# Patient Record
Sex: Female | Born: 1935 | Race: White | Hispanic: No | Marital: Married
Health system: Southern US, Community
[De-identification: ages and names within clinical notes are randomized; demographics above are authoritative.]

---

## 1999-01-01 ENCOUNTER — Encounter: Admission: RE | Admit: 1999-01-01 | Discharge: 1999-02-03 | Payer: Self-pay | Admitting: Anesthesiology

## 1999-02-04 ENCOUNTER — Encounter: Payer: Self-pay | Admitting: Orthopaedic Surgery

## 1999-02-04 ENCOUNTER — Encounter: Admission: RE | Admit: 1999-02-04 | Discharge: 1999-02-04 | Payer: Self-pay | Admitting: Orthopaedic Surgery

## 1999-05-01 ENCOUNTER — Encounter: Admission: RE | Admit: 1999-05-01 | Discharge: 1999-05-01 | Payer: Self-pay | Admitting: Family Medicine

## 1999-05-01 ENCOUNTER — Encounter: Payer: Self-pay | Admitting: Family Medicine

## 1999-08-13 ENCOUNTER — Encounter: Admission: RE | Admit: 1999-08-13 | Discharge: 1999-08-13 | Payer: Self-pay | Admitting: Family Medicine

## 1999-08-13 ENCOUNTER — Encounter: Payer: Self-pay | Admitting: Family Medicine

## 1999-08-27 ENCOUNTER — Encounter (INDEPENDENT_AMBULATORY_CARE_PROVIDER_SITE_OTHER): Payer: Self-pay | Admitting: Specialist

## 1999-08-27 ENCOUNTER — Encounter: Payer: Self-pay | Admitting: Family Medicine

## 1999-08-27 ENCOUNTER — Ambulatory Visit (HOSPITAL_COMMUNITY): Admission: RE | Admit: 1999-08-27 | Discharge: 1999-08-27 | Payer: Self-pay | Admitting: Family Medicine

## 2000-05-30 ENCOUNTER — Encounter: Payer: Self-pay | Admitting: Family Medicine

## 2000-05-30 ENCOUNTER — Encounter: Admission: RE | Admit: 2000-05-30 | Discharge: 2000-05-30 | Payer: Self-pay | Admitting: Family Medicine

## 2000-06-08 ENCOUNTER — Encounter: Payer: Self-pay | Admitting: Family Medicine

## 2000-06-08 ENCOUNTER — Encounter: Admission: RE | Admit: 2000-06-08 | Discharge: 2000-06-08 | Payer: Self-pay | Admitting: Family Medicine

## 2000-06-22 ENCOUNTER — Encounter: Payer: Self-pay | Admitting: Orthopaedic Surgery

## 2000-06-22 ENCOUNTER — Encounter: Admission: RE | Admit: 2000-06-22 | Discharge: 2000-06-22 | Payer: Self-pay | Admitting: Orthopaedic Surgery

## 2000-09-27 ENCOUNTER — Encounter: Payer: Self-pay | Admitting: Family Medicine

## 2000-09-27 ENCOUNTER — Encounter: Admission: RE | Admit: 2000-09-27 | Discharge: 2000-09-27 | Payer: Self-pay | Admitting: Family Medicine

## 2000-10-26 ENCOUNTER — Encounter: Admission: RE | Admit: 2000-10-26 | Discharge: 2000-10-26 | Payer: Self-pay | Admitting: Neurological Surgery

## 2000-10-26 ENCOUNTER — Encounter: Payer: Self-pay | Admitting: Neurological Surgery

## 2000-11-04 ENCOUNTER — Ambulatory Visit (HOSPITAL_COMMUNITY): Admission: RE | Admit: 2000-11-04 | Discharge: 2000-11-04 | Payer: Self-pay | Admitting: Cardiovascular Disease

## 2000-11-04 ENCOUNTER — Encounter: Payer: Self-pay | Admitting: Cardiovascular Disease

## 2001-01-25 ENCOUNTER — Encounter: Payer: Self-pay | Admitting: Otolaryngology

## 2001-01-25 ENCOUNTER — Encounter: Admission: RE | Admit: 2001-01-25 | Discharge: 2001-01-25 | Payer: Self-pay | Admitting: Otolaryngology

## 2001-06-27 ENCOUNTER — Ambulatory Visit (HOSPITAL_COMMUNITY): Admission: RE | Admit: 2001-06-27 | Discharge: 2001-06-27 | Payer: Self-pay | Admitting: General Surgery

## 2002-03-02 ENCOUNTER — Encounter: Payer: Self-pay | Admitting: General Surgery

## 2002-03-06 ENCOUNTER — Ambulatory Visit (HOSPITAL_COMMUNITY): Admission: RE | Admit: 2002-03-06 | Discharge: 2002-03-06 | Payer: Self-pay | Admitting: General Surgery

## 2002-12-19 ENCOUNTER — Inpatient Hospital Stay (HOSPITAL_COMMUNITY): Admission: EM | Admit: 2002-12-19 | Discharge: 2002-12-21 | Payer: Self-pay | Admitting: Emergency Medicine

## 2003-07-02 ENCOUNTER — Encounter: Admission: RE | Admit: 2003-07-02 | Discharge: 2003-07-02 | Payer: Self-pay | Admitting: Family Medicine

## 2003-08-16 ENCOUNTER — Encounter (HOSPITAL_COMMUNITY): Admission: RE | Admit: 2003-08-16 | Discharge: 2003-11-14 | Payer: Self-pay | Admitting: Nephrology

## 2003-11-21 ENCOUNTER — Encounter: Admission: RE | Admit: 2003-11-21 | Discharge: 2003-11-21 | Payer: Self-pay | Admitting: Family Medicine

## 2003-12-09 ENCOUNTER — Encounter (HOSPITAL_COMMUNITY): Admission: RE | Admit: 2003-12-09 | Discharge: 2004-03-08 | Payer: Self-pay | Admitting: Nephrology

## 2004-01-06 ENCOUNTER — Ambulatory Visit (HOSPITAL_COMMUNITY): Admission: RE | Admit: 2004-01-06 | Discharge: 2004-01-06 | Payer: Self-pay | Admitting: Gastroenterology

## 2004-03-16 ENCOUNTER — Encounter (HOSPITAL_COMMUNITY): Admission: RE | Admit: 2004-03-16 | Discharge: 2004-06-14 | Payer: Self-pay | Admitting: Nephrology

## 2004-07-10 ENCOUNTER — Encounter (HOSPITAL_COMMUNITY): Admission: RE | Admit: 2004-07-10 | Discharge: 2004-10-08 | Payer: Self-pay | Admitting: Nephrology

## 2004-10-09 ENCOUNTER — Encounter (HOSPITAL_COMMUNITY): Admission: RE | Admit: 2004-10-09 | Discharge: 2005-01-07 | Payer: Self-pay | Admitting: Nephrology

## 2005-01-18 ENCOUNTER — Encounter: Admission: RE | Admit: 2005-01-18 | Discharge: 2005-01-18 | Payer: Self-pay | Admitting: Family Medicine

## 2005-01-29 ENCOUNTER — Encounter (HOSPITAL_COMMUNITY): Admission: RE | Admit: 2005-01-29 | Discharge: 2005-04-29 | Payer: Self-pay | Admitting: Nephrology

## 2005-04-30 ENCOUNTER — Encounter (HOSPITAL_COMMUNITY): Admission: RE | Admit: 2005-04-30 | Discharge: 2005-07-29 | Payer: Self-pay | Admitting: Nephrology

## 2005-06-25 ENCOUNTER — Ambulatory Visit (HOSPITAL_COMMUNITY): Admission: RE | Admit: 2005-06-25 | Discharge: 2005-06-25 | Payer: Self-pay | Admitting: Nephrology

## 2005-06-29 ENCOUNTER — Encounter (INDEPENDENT_AMBULATORY_CARE_PROVIDER_SITE_OTHER): Payer: Self-pay | Admitting: Cardiology

## 2005-07-26 ENCOUNTER — Encounter: Admission: RE | Admit: 2005-07-26 | Discharge: 2005-07-26 | Payer: Self-pay | Admitting: Family Medicine

## 2005-07-30 ENCOUNTER — Encounter (HOSPITAL_COMMUNITY): Admission: RE | Admit: 2005-07-30 | Discharge: 2005-10-28 | Payer: Self-pay | Admitting: Nephrology

## 2005-08-27 ENCOUNTER — Ambulatory Visit (HOSPITAL_COMMUNITY): Admission: RE | Admit: 2005-08-27 | Discharge: 2005-08-27 | Payer: Self-pay | Admitting: Nephrology

## 2006-02-24 ENCOUNTER — Encounter: Admission: RE | Admit: 2006-02-24 | Discharge: 2006-02-24 | Payer: Self-pay | Admitting: Family Medicine

## 2006-05-31 ENCOUNTER — Ambulatory Visit (HOSPITAL_BASED_OUTPATIENT_CLINIC_OR_DEPARTMENT_OTHER): Admission: RE | Admit: 2006-05-31 | Discharge: 2006-05-31 | Payer: Self-pay | Admitting: Otolaryngology

## 2006-05-31 ENCOUNTER — Encounter (INDEPENDENT_AMBULATORY_CARE_PROVIDER_SITE_OTHER): Payer: Self-pay | Admitting: Specialist

## 2006-10-26 ENCOUNTER — Encounter: Admission: RE | Admit: 2006-10-26 | Discharge: 2006-10-26 | Payer: Self-pay | Admitting: Family Medicine

## 2007-04-28 ENCOUNTER — Encounter: Admission: RE | Admit: 2007-04-28 | Discharge: 2007-04-28 | Payer: Self-pay | Admitting: Family Medicine

## 2007-10-09 ENCOUNTER — Inpatient Hospital Stay (HOSPITAL_COMMUNITY): Admission: EM | Admit: 2007-10-09 | Discharge: 2007-10-12 | Payer: Self-pay | Admitting: Emergency Medicine

## 2007-10-11 ENCOUNTER — Encounter: Payer: Self-pay | Admitting: Cardiovascular Disease

## 2007-12-02 ENCOUNTER — Emergency Department (HOSPITAL_COMMUNITY): Admission: EM | Admit: 2007-12-02 | Discharge: 2007-12-03 | Payer: Self-pay | Admitting: Emergency Medicine

## 2008-06-28 ENCOUNTER — Encounter: Admission: RE | Admit: 2008-06-28 | Discharge: 2008-06-28 | Payer: Self-pay | Admitting: Family Medicine

## 2008-07-20 ENCOUNTER — Emergency Department (HOSPITAL_COMMUNITY): Admission: EM | Admit: 2008-07-20 | Discharge: 2008-07-21 | Payer: Self-pay | Admitting: Emergency Medicine

## 2008-07-31 ENCOUNTER — Observation Stay (HOSPITAL_COMMUNITY): Admission: EM | Admit: 2008-07-31 | Discharge: 2008-08-02 | Payer: Self-pay | Admitting: Emergency Medicine

## 2009-03-06 ENCOUNTER — Encounter (HOSPITAL_BASED_OUTPATIENT_CLINIC_OR_DEPARTMENT_OTHER): Admission: RE | Admit: 2009-03-06 | Discharge: 2009-06-04 | Payer: Self-pay | Admitting: Internal Medicine

## 2009-03-06 ENCOUNTER — Encounter: Admission: RE | Admit: 2009-03-06 | Discharge: 2009-03-06 | Payer: Self-pay | Admitting: *Deleted

## 2009-05-26 ENCOUNTER — Inpatient Hospital Stay (HOSPITAL_COMMUNITY): Admission: EM | Admit: 2009-05-26 | Discharge: 2009-06-11 | Payer: Self-pay | Admitting: Emergency Medicine

## 2009-05-26 ENCOUNTER — Encounter (INDEPENDENT_AMBULATORY_CARE_PROVIDER_SITE_OTHER): Payer: Self-pay | Admitting: Internal Medicine

## 2009-05-26 ENCOUNTER — Ambulatory Visit: Payer: Self-pay | Admitting: Internal Medicine

## 2009-05-29 ENCOUNTER — Encounter (INDEPENDENT_AMBULATORY_CARE_PROVIDER_SITE_OTHER): Payer: Self-pay | Admitting: Internal Medicine

## 2009-06-05 ENCOUNTER — Ambulatory Visit: Payer: Self-pay | Admitting: Physical Medicine & Rehabilitation

## 2009-06-27 ENCOUNTER — Inpatient Hospital Stay (HOSPITAL_COMMUNITY): Admission: EM | Admit: 2009-06-27 | Discharge: 2009-07-18 | Payer: Self-pay | Admitting: Emergency Medicine

## 2009-06-27 ENCOUNTER — Ambulatory Visit: Payer: Self-pay | Admitting: Emergency Medicine

## 2009-06-30 ENCOUNTER — Encounter (INDEPENDENT_AMBULATORY_CARE_PROVIDER_SITE_OTHER): Payer: Self-pay | Admitting: Critical Care Medicine

## 2009-07-04 ENCOUNTER — Ambulatory Visit: Payer: Self-pay | Admitting: Physical Medicine & Rehabilitation

## 2009-08-04 ENCOUNTER — Emergency Department (HOSPITAL_COMMUNITY): Admission: EM | Admit: 2009-08-04 | Discharge: 2009-08-04 | Payer: Self-pay | Admitting: Emergency Medicine

## 2009-08-31 ENCOUNTER — Inpatient Hospital Stay (HOSPITAL_COMMUNITY): Admission: EM | Admit: 2009-08-31 | Discharge: 2009-10-03 | Payer: Self-pay | Admitting: Emergency Medicine

## 2009-08-31 ENCOUNTER — Ambulatory Visit: Payer: Self-pay | Admitting: Internal Medicine

## 2009-09-01 ENCOUNTER — Encounter: Payer: Self-pay | Admitting: Family Medicine

## 2009-09-15 ENCOUNTER — Ambulatory Visit: Payer: Self-pay | Admitting: Physical Medicine & Rehabilitation

## 2009-10-02 ENCOUNTER — Ambulatory Visit: Payer: Self-pay | Admitting: Vascular Surgery

## 2009-10-14 ENCOUNTER — Ambulatory Visit: Payer: Self-pay | Admitting: Vascular Surgery

## 2009-10-16 ENCOUNTER — Ambulatory Visit (HOSPITAL_COMMUNITY): Admission: RE | Admit: 2009-10-16 | Discharge: 2009-10-16 | Payer: Self-pay | Admitting: Vascular Surgery

## 2009-10-21 ENCOUNTER — Ambulatory Visit: Payer: Self-pay | Admitting: Vascular Surgery

## 2009-10-31 ENCOUNTER — Ambulatory Visit: Payer: Self-pay | Admitting: Vascular Surgery

## 2009-11-13 ENCOUNTER — Ambulatory Visit (HOSPITAL_COMMUNITY): Admission: RE | Admit: 2009-11-13 | Discharge: 2009-11-13 | Payer: Self-pay | Admitting: Gastroenterology

## 2009-11-14 ENCOUNTER — Ambulatory Visit: Payer: Self-pay | Admitting: Vascular Surgery

## 2009-11-20 ENCOUNTER — Ambulatory Visit (HOSPITAL_COMMUNITY): Admission: RE | Admit: 2009-11-20 | Discharge: 2009-11-20 | Payer: Self-pay | Admitting: Vascular Surgery

## 2009-11-20 ENCOUNTER — Ambulatory Visit: Payer: Self-pay | Admitting: Vascular Surgery

## 2009-11-21 ENCOUNTER — Ambulatory Visit: Payer: Self-pay | Admitting: Vascular Surgery

## 2009-12-02 ENCOUNTER — Ambulatory Visit (HOSPITAL_COMMUNITY): Admission: RE | Admit: 2009-12-02 | Discharge: 2009-12-02 | Payer: Self-pay | Admitting: Vascular Surgery

## 2009-12-19 ENCOUNTER — Inpatient Hospital Stay (HOSPITAL_COMMUNITY)
Admission: EM | Admit: 2009-12-19 | Discharge: 2010-01-22 | Payer: Self-pay | Source: Home / Self Care | Attending: Internal Medicine | Admitting: Internal Medicine

## 2010-02-26 ENCOUNTER — Inpatient Hospital Stay (HOSPITAL_COMMUNITY)
Admission: EM | Admit: 2010-02-26 | Discharge: 2010-04-16 | DRG: 314 | Disposition: E | Payer: Medicare Other | Attending: Pulmonary Disease | Admitting: Pulmonary Disease

## 2010-02-26 DIAGNOSIS — B3749 Other urogenital candidiasis: Secondary | ICD-10-CM | POA: Diagnosis not present

## 2010-02-26 DIAGNOSIS — N2581 Secondary hyperparathyroidism of renal origin: Secondary | ICD-10-CM | POA: Diagnosis present

## 2010-02-26 DIAGNOSIS — R627 Adult failure to thrive: Secondary | ICD-10-CM | POA: Diagnosis present

## 2010-02-26 DIAGNOSIS — A0472 Enterocolitis due to Clostridium difficile, not specified as recurrent: Secondary | ICD-10-CM | POA: Diagnosis present

## 2010-02-26 DIAGNOSIS — I509 Heart failure, unspecified: Secondary | ICD-10-CM | POA: Diagnosis present

## 2010-02-26 DIAGNOSIS — E1142 Type 2 diabetes mellitus with diabetic polyneuropathy: Secondary | ICD-10-CM | POA: Diagnosis present

## 2010-02-26 DIAGNOSIS — M109 Gout, unspecified: Secondary | ICD-10-CM | POA: Diagnosis present

## 2010-02-26 DIAGNOSIS — Z992 Dependence on renal dialysis: Secondary | ICD-10-CM

## 2010-02-26 DIAGNOSIS — Z515 Encounter for palliative care: Secondary | ICD-10-CM

## 2010-02-26 DIAGNOSIS — E872 Acidosis, unspecified: Secondary | ICD-10-CM | POA: Diagnosis present

## 2010-02-26 DIAGNOSIS — R5381 Other malaise: Secondary | ICD-10-CM | POA: Diagnosis present

## 2010-02-26 DIAGNOSIS — R112 Nausea with vomiting, unspecified: Secondary | ICD-10-CM | POA: Diagnosis present

## 2010-02-26 DIAGNOSIS — Z7901 Long term (current) use of anticoagulants: Secondary | ICD-10-CM

## 2010-02-26 DIAGNOSIS — Y841 Kidney dialysis as the cause of abnormal reaction of the patient, or of later complication, without mention of misadventure at the time of the procedure: Secondary | ICD-10-CM | POA: Diagnosis present

## 2010-02-26 DIAGNOSIS — Z7982 Long term (current) use of aspirin: Secondary | ICD-10-CM

## 2010-02-26 DIAGNOSIS — Z794 Long term (current) use of insulin: Secondary | ICD-10-CM

## 2010-02-26 DIAGNOSIS — I120 Hypertensive chronic kidney disease with stage 5 chronic kidney disease or end stage renal disease: Secondary | ICD-10-CM | POA: Diagnosis present

## 2010-02-26 DIAGNOSIS — B9689 Other specified bacterial agents as the cause of diseases classified elsewhere: Secondary | ICD-10-CM | POA: Diagnosis present

## 2010-02-26 DIAGNOSIS — Z66 Do not resuscitate: Secondary | ICD-10-CM | POA: Diagnosis not present

## 2010-02-26 DIAGNOSIS — J9620 Acute and chronic respiratory failure, unspecified whether with hypoxia or hypercapnia: Secondary | ICD-10-CM | POA: Diagnosis present

## 2010-02-26 DIAGNOSIS — I4891 Unspecified atrial fibrillation: Secondary | ICD-10-CM | POA: Diagnosis present

## 2010-02-26 DIAGNOSIS — D638 Anemia in other chronic diseases classified elsewhere: Secondary | ICD-10-CM | POA: Diagnosis present

## 2010-02-26 DIAGNOSIS — I872 Venous insufficiency (chronic) (peripheral): Secondary | ICD-10-CM | POA: Diagnosis present

## 2010-02-26 DIAGNOSIS — Y833 Surgical operation with formation of external stoma as the cause of abnormal reaction of the patient, or of later complication, without mention of misadventure at the time of the procedure: Secondary | ICD-10-CM | POA: Diagnosis not present

## 2010-02-26 DIAGNOSIS — D696 Thrombocytopenia, unspecified: Secondary | ICD-10-CM | POA: Diagnosis present

## 2010-02-26 DIAGNOSIS — K3184 Gastroparesis: Secondary | ICD-10-CM | POA: Diagnosis present

## 2010-02-26 DIAGNOSIS — E1149 Type 2 diabetes mellitus with other diabetic neurological complication: Secondary | ICD-10-CM | POA: Diagnosis present

## 2010-02-26 DIAGNOSIS — E43 Unspecified severe protein-calorie malnutrition: Secondary | ICD-10-CM | POA: Diagnosis present

## 2010-02-26 DIAGNOSIS — K219 Gastro-esophageal reflux disease without esophagitis: Secondary | ICD-10-CM | POA: Diagnosis present

## 2010-02-26 DIAGNOSIS — J9819 Other pulmonary collapse: Secondary | ICD-10-CM | POA: Diagnosis present

## 2010-02-26 DIAGNOSIS — N186 End stage renal disease: Secondary | ICD-10-CM | POA: Diagnosis present

## 2010-02-26 DIAGNOSIS — K9423 Gastrostomy malfunction: Secondary | ICD-10-CM | POA: Diagnosis not present

## 2010-02-26 DIAGNOSIS — D72829 Elevated white blood cell count, unspecified: Secondary | ICD-10-CM | POA: Diagnosis present

## 2010-02-26 DIAGNOSIS — T827XXA Infection and inflammatory reaction due to other cardiac and vascular devices, implants and grafts, initial encounter: Principal | ICD-10-CM | POA: Diagnosis present

## 2010-02-26 DIAGNOSIS — Z950 Presence of cardiac pacemaker: Secondary | ICD-10-CM

## 2010-02-26 DIAGNOSIS — B961 Klebsiella pneumoniae [K. pneumoniae] as the cause of diseases classified elsewhere: Secondary | ICD-10-CM | POA: Diagnosis present

## 2010-02-26 DIAGNOSIS — E669 Obesity, unspecified: Secondary | ICD-10-CM | POA: Diagnosis present

## 2010-02-26 DIAGNOSIS — A419 Sepsis, unspecified organism: Secondary | ICD-10-CM | POA: Diagnosis present

## 2010-02-26 DIAGNOSIS — E785 Hyperlipidemia, unspecified: Secondary | ICD-10-CM | POA: Diagnosis present

## 2010-02-26 DIAGNOSIS — E871 Hypo-osmolality and hyponatremia: Secondary | ICD-10-CM | POA: Diagnosis present

## 2010-02-26 DIAGNOSIS — J189 Pneumonia, unspecified organism: Secondary | ICD-10-CM | POA: Diagnosis present

## 2010-02-26 DIAGNOSIS — K2970 Gastritis, unspecified, without bleeding: Secondary | ICD-10-CM | POA: Diagnosis present

## 2010-02-26 DIAGNOSIS — F411 Generalized anxiety disorder: Secondary | ICD-10-CM | POA: Diagnosis present

## 2010-03-02 LAB — COMPREHENSIVE METABOLIC PANEL
ALT: 17 U/L (ref 0–35)
ALT: 14 U/L (ref 0–35)
AST: 27 U/L (ref 0–37)
AST: 16 U/L (ref 0–37)
Albumin: 2.5 g/dL — ABNORMAL LOW (ref 3.5–5.2)
Albumin: 2.3 g/dL — ABNORMAL LOW (ref 3.5–5.2)
Alkaline Phosphatase: 91 U/L (ref 39–117)
Alkaline Phosphatase: 87 U/L (ref 39–117)
BUN: 66 mg/dL — ABNORMAL HIGH (ref 6–23)
BUN: 34 mg/dL — ABNORMAL HIGH (ref 6–23)
CO2: 22 mEq/L (ref 19–32)
CO2: 26 mEq/L (ref 19–32)
Calcium: 8.8 mg/dL (ref 8.4–10.5)
Calcium: 9 mg/dL (ref 8.4–10.5)
Chloride: 95 mEq/L — ABNORMAL LOW (ref 96–112)
Chloride: 99 mEq/L (ref 96–112)
Creatinine, Ser: 5.51 mg/dL — ABNORMAL HIGH (ref 0.4–1.2)
Creatinine, Ser: 3.55 mg/dL — ABNORMAL HIGH (ref 0.4–1.2)
GFR calc Af Amer: 9 mL/min — ABNORMAL LOW (ref >60)
GFR calc Af Amer: 15 mL/min — ABNORMAL LOW (ref >60)
GFR calc non Af Amer: 8 mL/min — ABNORMAL LOW (ref >60)
GFR calc non Af Amer: 13 mL/min — ABNORMAL LOW (ref >60)
Glucose, Bld: 268 mg/dL — ABNORMAL HIGH (ref 70–99)
Glucose, Bld: 120 mg/dL — ABNORMAL HIGH (ref 70–99)
Potassium: 3.8 mEq/L (ref 3.5–5.1)
Potassium: 3.5 mEq/L (ref 3.5–5.1)
Sodium: 133 mEq/L — ABNORMAL LOW (ref 135–145)
Sodium: 137 mEq/L (ref 135–145)
Total Bilirubin: 0.4 mg/dL (ref 0.3–1.2)
Total Bilirubin: 0.5 mg/dL (ref 0.3–1.2)
Total Protein: 5.5 g/dL — ABNORMAL LOW (ref 6.0–8.3)
Total Protein: 5.4 g/dL — ABNORMAL LOW (ref 6.0–8.3)

## 2010-03-02 LAB — GLUCOSE, CAPILLARY
Glucose-Capillary: 265 mg/dL — ABNORMAL HIGH (ref 70–99)
Glucose-Capillary: 112 mg/dL — ABNORMAL HIGH (ref 70–99)
Glucose-Capillary: 120 mg/dL — ABNORMAL HIGH (ref 70–99)
Glucose-Capillary: 124 mg/dL — ABNORMAL HIGH (ref 70–99)
Glucose-Capillary: 135 mg/dL — ABNORMAL HIGH (ref 70–99)
Glucose-Capillary: 139 mg/dL — ABNORMAL HIGH (ref 70–99)
Glucose-Capillary: 164 mg/dL — ABNORMAL HIGH (ref 70–99)
Glucose-Capillary: 174 mg/dL — ABNORMAL HIGH (ref 70–99)
Glucose-Capillary: 175 mg/dL — ABNORMAL HIGH (ref 70–99)
Glucose-Capillary: 176 mg/dL — ABNORMAL HIGH (ref 70–99)
Glucose-Capillary: 178 mg/dL — ABNORMAL HIGH (ref 70–99)
Glucose-Capillary: 178 mg/dL — ABNORMAL HIGH (ref 70–99)
Glucose-Capillary: 246 mg/dL — ABNORMAL HIGH (ref 70–99)
Glucose-Capillary: 113 mg/dL — ABNORMAL HIGH (ref 70–99)
Glucose-Capillary: 113 mg/dL — ABNORMAL HIGH (ref 70–99)
Glucose-Capillary: 113 mg/dL — ABNORMAL HIGH (ref 70–99)
Glucose-Capillary: 134 mg/dL — ABNORMAL HIGH (ref 70–99)
Glucose-Capillary: 159 mg/dL — ABNORMAL HIGH (ref 70–99)
Glucose-Capillary: 190 mg/dL — ABNORMAL HIGH (ref 70–99)
Glucose-Capillary: 205 mg/dL — ABNORMAL HIGH (ref 70–99)

## 2010-03-02 LAB — DIFFERENTIAL
Basophils Absolute: 0 10*3/uL (ref 0.0–0.1)
Basophils Absolute: 0 10*3/uL (ref 0.0–0.1)
Basophils Relative: 0 % (ref 0–1)
Basophils Relative: 0 % (ref 0–1)
Eosinophils Absolute: 0 10*3/uL (ref 0.0–0.7)
Eosinophils Absolute: 0.3 10*3/uL (ref 0.0–0.7)
Eosinophils Relative: 0 % (ref 0–5)
Eosinophils Relative: 2 % (ref 0–5)
Lymphocytes Relative: 6 % — ABNORMAL LOW (ref 12–46)
Lymphocytes Relative: 7 % — ABNORMAL LOW (ref 12–46)
Lymphs Abs: 1.4 10*3/uL (ref 0.7–4.0)
Lymphs Abs: 1.2 10*3/uL (ref 0.7–4.0)
Monocytes Absolute: 0.8 10*3/uL (ref 0.1–1.0)
Monocytes Absolute: 1 10*3/uL (ref 0.1–1.0)
Monocytes Relative: 3 % (ref 3–12)
Monocytes Relative: 6 % (ref 3–12)
Neutro Abs: 20.3 10*3/uL — ABNORMAL HIGH (ref 1.7–7.7)
Neutro Abs: 14.6 10*3/uL — ABNORMAL HIGH (ref 1.7–7.7)
Neutrophils Relative %: 90 % — ABNORMAL HIGH (ref 43–77)
Neutrophils Relative %: 85 % — ABNORMAL HIGH (ref 43–77)

## 2010-03-02 LAB — CARDIAC PANEL(CRET KIN+CKTOT+MB+TROPI)
CK, MB: 2.3 ng/mL (ref 0.3–4.0)
CK, MB: 2.4 ng/mL (ref 0.3–4.0)
Relative Index: INVALID (ref 0.0–2.5)
Relative Index: INVALID (ref 0.0–2.5)
Total CK: 20 U/L (ref 7–177)
Total CK: 16 U/L (ref 7–177)
Troponin I: 0.04 ng/mL (ref 0.00–0.06)
Troponin I: 0.07 ng/mL — ABNORMAL HIGH (ref 0.00–0.06)

## 2010-03-02 LAB — URINALYSIS, ROUTINE W REFLEX MICROSCOPIC
Ketones, ur: 15 mg/dL — AB
Nitrite: NEGATIVE
Protein, ur: 30 mg/dL — AB
Specific Gravity, Urine: 1.02 (ref 1.005–1.030)
Urine Glucose, Fasting: NEGATIVE mg/dL
Urobilinogen, UA: 0.2 mg/dL (ref 0.0–1.0)
pH: 5 (ref 5.0–8.0)

## 2010-03-02 LAB — PROTIME-INR
INR: 1.94 — ABNORMAL HIGH (ref 0.00–1.49)
INR: 2.94 — ABNORMAL HIGH (ref 0.00–1.49)
INR: 3.09 — ABNORMAL HIGH (ref 0.00–1.49)
INR: 2.62 — ABNORMAL HIGH (ref 0.00–1.49)
Prothrombin Time: 22.3 seconds — ABNORMAL HIGH (ref 11.6–15.2)
Prothrombin Time: 30.7 seconds — ABNORMAL HIGH (ref 11.6–15.2)
Prothrombin Time: 31.9 seconds — ABNORMAL HIGH (ref 11.6–15.2)
Prothrombin Time: 28.1 seconds — ABNORMAL HIGH (ref 11.6–15.2)

## 2010-03-02 LAB — BASIC METABOLIC PANEL
BUN: 21 mg/dL (ref 6–23)
CO2: 30 mEq/L (ref 19–32)
Calcium: 8.4 mg/dL (ref 8.4–10.5)
Chloride: 101 mEq/L (ref 96–112)
Creatinine, Ser: 2.51 mg/dL — ABNORMAL HIGH (ref 0.4–1.2)
GFR calc Af Amer: 23 mL/min — ABNORMAL LOW (ref >60)
GFR calc non Af Amer: 19 mL/min — ABNORMAL LOW (ref >60)
Glucose, Bld: 143 mg/dL — ABNORMAL HIGH (ref 70–99)
Potassium: 4 mEq/L (ref 3.5–5.1)
Sodium: 140 mEq/L (ref 135–145)

## 2010-03-02 LAB — CBC
HCT: 39.9 % (ref 36.0–46.0)
HCT: 34.6 % — ABNORMAL LOW (ref 36.0–46.0)
HCT: 32.3 % — ABNORMAL LOW (ref 36.0–46.0)
HCT: 35.1 % — ABNORMAL LOW (ref 36.0–46.0)
Hemoglobin: 12.2 g/dL (ref 12.0–15.0)
Hemoglobin: 10.3 g/dL — ABNORMAL LOW (ref 12.0–15.0)
Hemoglobin: 9.6 g/dL — ABNORMAL LOW (ref 12.0–15.0)
Hemoglobin: 10.3 g/dL — ABNORMAL LOW (ref 12.0–15.0)
MCH: 29.5 pg (ref 26.0–34.0)
MCH: 29.5 pg (ref 26.0–34.0)
MCH: 29.6 pg (ref 26.0–34.0)
MCH: 29.3 pg (ref 26.0–34.0)
MCHC: 30.6 g/dL (ref 30.0–36.0)
MCHC: 29.8 g/dL — ABNORMAL LOW (ref 30.0–36.0)
MCHC: 29.7 g/dL — ABNORMAL LOW (ref 30.0–36.0)
MCHC: 29.3 g/dL — ABNORMAL LOW (ref 30.0–36.0)
MCV: 96.6 fL (ref 78.0–100.0)
MCV: 99.1 fL (ref 78.0–100.0)
MCV: 99.7 fL (ref 78.0–100.0)
MCV: 99.7 fL (ref 78.0–100.0)
Platelets: 351 10*3/uL (ref 150–400)
Platelets: 302 10*3/uL (ref 150–400)
Platelets: 252 10*3/uL (ref 150–400)
Platelets: 282 10*3/uL (ref 150–400)
RBC: 4.13 MIL/uL (ref 3.87–5.11)
RBC: 3.49 MIL/uL — ABNORMAL LOW (ref 3.87–5.11)
RBC: 3.24 MIL/uL — ABNORMAL LOW (ref 3.87–5.11)
RBC: 3.52 MIL/uL — ABNORMAL LOW (ref 3.87–5.11)
RDW: 16.5 % — ABNORMAL HIGH (ref 11.5–15.5)
RDW: 17 % — ABNORMAL HIGH (ref 11.5–15.5)
RDW: 16.8 % — ABNORMAL HIGH (ref 11.5–15.5)
RDW: 16.5 % — ABNORMAL HIGH (ref 11.5–15.5)
WBC: 22.5 10*3/uL — ABNORMAL HIGH (ref 4.0–10.5)
WBC: 22.4 10*3/uL — ABNORMAL HIGH (ref 4.0–10.5)
WBC: 17.1 10*3/uL — ABNORMAL HIGH (ref 4.0–10.5)
WBC: 18.8 10*3/uL — ABNORMAL HIGH (ref 4.0–10.5)

## 2010-03-02 LAB — BRAIN NATRIURETIC PEPTIDE: Pro B Natriuretic peptide (BNP): 146 pg/mL — ABNORMAL HIGH (ref 0.0–100.0)

## 2010-03-02 LAB — URINE MICROSCOPIC-ADD ON

## 2010-03-02 LAB — POCT I-STAT, CHEM 8
BUN: 79 mg/dL — ABNORMAL HIGH (ref 6–23)
Calcium, Ion: 1.09 mmol/L — ABNORMAL LOW (ref 1.12–1.32)
Chloride: 98 mEq/L (ref 96–112)
Creatinine, Ser: 5.3 mg/dL — ABNORMAL HIGH (ref 0.4–1.2)
Glucose, Bld: 252 mg/dL — ABNORMAL HIGH (ref 70–99)
HCT: 42 % (ref 36.0–46.0)
Hemoglobin: 14.3 g/dL (ref 12.0–15.0)
Potassium: 3.5 mEq/L (ref 3.5–5.1)
Sodium: 129 mEq/L — ABNORMAL LOW (ref 135–145)
TCO2: 25 mmol/L (ref 0–100)

## 2010-03-02 LAB — HEMOGLOBIN A1C
Hgb A1c MFr Bld: 6.1 % — ABNORMAL HIGH (ref <5.7)
Mean Plasma Glucose: 128 mg/dL — ABNORMAL HIGH (ref <117)

## 2010-03-02 LAB — LACTIC ACID, PLASMA
Lactic Acid, Venous: 3.7 mmol/L — ABNORMAL HIGH (ref 0.5–2.2)
Lactic Acid, Venous: 0.9 mmol/L (ref 0.5–2.2)

## 2010-03-02 LAB — POCT CARDIAC MARKERS
CKMB, poc: 3.4 ng/mL (ref 1.0–8.0)
Myoglobin, poc: 220 ng/mL (ref 12–200)
Troponin i, poc: <0.05 ng/mL (ref 0.00–0.09)

## 2010-03-02 LAB — URINE CULTURE
Colony Count: NO GROWTH
Culture  Setup Time: 201201111200
Culture: NO GROWTH

## 2010-03-02 LAB — MRSA PCR SCREENING: MRSA by PCR: NEGATIVE

## 2010-03-02 LAB — CK TOTAL AND CKMB (NOT AT ARMC)
CK, MB: 3.5 ng/mL (ref 0.3–4.0)
Relative Index: INVALID (ref 0.0–2.5)
Total CK: 26 U/L (ref 7–177)

## 2010-03-02 LAB — TROPONIN I: Troponin I: 0.08 ng/mL — ABNORMAL HIGH (ref 0.00–0.06)

## 2010-03-02 LAB — APTT: aPTT: 30 seconds (ref 24–37)

## 2010-03-02 LAB — PROCALCITONIN
Procalcitonin: 0.95 ng/mL
Procalcitonin: 28.01 ng/mL

## 2010-03-02 LAB — PREALBUMIN: Prealbumin: 13.4 mg/dL — ABNORMAL LOW (ref 17.0–34.0)

## 2010-03-02 LAB — CLOSTRIDIUM DIFFICILE BY PCR

## 2010-03-02 LAB — PHOSPHORUS: Phosphorus: 8.6 mg/dL — ABNORMAL HIGH (ref 2.3–4.6)

## 2010-03-04 LAB — RENAL FUNCTION PANEL
Albumin: 2.2 g/dL — ABNORMAL LOW (ref 3.5–5.2)
Albumin: 2.2 g/dL — ABNORMAL LOW (ref 3.5–5.2)
BUN: 53 mg/dL — ABNORMAL HIGH (ref 6–23)
BUN: 58 mg/dL — ABNORMAL HIGH (ref 6–23)
CO2: 23 mEq/L (ref 19–32)
CO2: 25 mEq/L (ref 19–32)
Calcium: 9.2 mg/dL (ref 8.4–10.5)
Calcium: 9.2 mg/dL (ref 8.4–10.5)
Chloride: 98 mEq/L (ref 96–112)
Chloride: 98 mEq/L (ref 96–112)
Creatinine, Ser: 5.7 mg/dL — ABNORMAL HIGH (ref 0.4–1.2)
Creatinine, Ser: 6.48 mg/dL — ABNORMAL HIGH (ref 0.4–1.2)
GFR calc Af Amer: 9 mL/min — ABNORMAL LOW (ref >60)
GFR calc Af Amer: 8 mL/min — ABNORMAL LOW (ref >60)
GFR calc non Af Amer: 7 mL/min — ABNORMAL LOW (ref >60)
GFR calc non Af Amer: 6 mL/min — ABNORMAL LOW (ref >60)
Glucose, Bld: 202 mg/dL — ABNORMAL HIGH (ref 70–99)
Glucose, Bld: 121 mg/dL — ABNORMAL HIGH (ref 70–99)
Phosphorus: 9.1 mg/dL (ref 2.3–4.6)
Phosphorus: 9.5 mg/dL (ref 2.3–4.6)
Potassium: 3.6 mEq/L (ref 3.5–5.1)
Potassium: 3.6 mEq/L (ref 3.5–5.1)
Sodium: 136 mEq/L (ref 135–145)
Sodium: 138 mEq/L (ref 135–145)

## 2010-03-04 LAB — BLOOD GAS, ARTERIAL
Acid-base deficit: 5.1 mmol/L — ABNORMAL HIGH (ref 0.0–2.0)
Acid-base deficit: 4.8 mmol/L — ABNORMAL HIGH (ref 0.0–2.0)
Bicarbonate: 22.7 mEq/L (ref 20.0–24.0)
Bicarbonate: 23 mEq/L (ref 20.0–24.0)
Delivery systems: POSITIVE
Drawn by: 32131
Drawn by: 321311
FIO2: 100 %
Inspiratory PAP: 16
Mode: POSITIVE
O2 Content: 3 L/min
O2 Saturation: 89.5 %
O2 Saturation: 92 %
PEEP: 6 cmH2O
Patient temperature: 98.6
Patient temperature: 98.6
TCO2: 24.9 mmol/L (ref 0–100)
TCO2: 25.2 mmol/L (ref 0–100)
pCO2 arterial: 69.4 mmHg (ref 35.0–45.0)
pCO2 arterial: 70.3 mmHg (ref 35.0–45.0)
pH, Arterial: 7.142 — CL (ref 7.350–7.400)
pH, Arterial: 7.141 — CL (ref 7.350–7.400)
pO2, Arterial: 61.8 mmHg — ABNORMAL LOW (ref 80.0–100.0)
pO2, Arterial: 75 mmHg — ABNORMAL LOW (ref 80.0–100.0)

## 2010-03-04 LAB — GLUCOSE, CAPILLARY
Glucose-Capillary: 110 mg/dL — ABNORMAL HIGH (ref 70–99)
Glucose-Capillary: 142 mg/dL — ABNORMAL HIGH (ref 70–99)
Glucose-Capillary: 180 mg/dL — ABNORMAL HIGH (ref 70–99)
Glucose-Capillary: 196 mg/dL — ABNORMAL HIGH (ref 70–99)
Glucose-Capillary: 82 mg/dL (ref 70–99)
Glucose-Capillary: 93 mg/dL (ref 70–99)
Glucose-Capillary: 102 mg/dL — ABNORMAL HIGH (ref 70–99)
Glucose-Capillary: 118 mg/dL — ABNORMAL HIGH (ref 70–99)
Glucose-Capillary: 69 mg/dL — ABNORMAL LOW (ref 70–99)
Glucose-Capillary: 74 mg/dL (ref 70–99)
Glucose-Capillary: 81 mg/dL (ref 70–99)
Glucose-Capillary: 84 mg/dL (ref 70–99)
Glucose-Capillary: 87 mg/dL (ref 70–99)

## 2010-03-04 LAB — PREPARE FRESH FROZEN PLASMA
Unit division: 0
Unit division: 0

## 2010-03-04 LAB — PROTIME-INR
INR: 2.47 — ABNORMAL HIGH (ref 0.00–1.49)
INR: 2.01 — ABNORMAL HIGH (ref 0.00–1.49)
INR: 1.74 — ABNORMAL HIGH (ref 0.00–1.49)
Prothrombin Time: 26.9 seconds — ABNORMAL HIGH (ref 11.6–15.2)
Prothrombin Time: 22.9 seconds — ABNORMAL HIGH (ref 11.6–15.2)
Prothrombin Time: 20.5 seconds — ABNORMAL HIGH (ref 11.6–15.2)

## 2010-03-04 LAB — CBC
HCT: 33.8 % — ABNORMAL LOW (ref 36.0–46.0)
HCT: 30.3 % — ABNORMAL LOW (ref 36.0–46.0)
HCT: 29.4 % — ABNORMAL LOW (ref 36.0–46.0)
Hemoglobin: 9.9 g/dL — ABNORMAL LOW (ref 12.0–15.0)
Hemoglobin: 8.9 g/dL — ABNORMAL LOW (ref 12.0–15.0)
Hemoglobin: 8.8 g/dL — ABNORMAL LOW (ref 12.0–15.0)
MCH: 29.2 pg (ref 26.0–34.0)
MCH: 28.9 pg (ref 26.0–34.0)
MCH: 29.3 pg (ref 26.0–34.0)
MCHC: 29.3 g/dL — ABNORMAL LOW (ref 30.0–36.0)
MCHC: 29.4 g/dL — ABNORMAL LOW (ref 30.0–36.0)
MCHC: 29.9 g/dL — ABNORMAL LOW (ref 30.0–36.0)
MCV: 99.7 fL (ref 78.0–100.0)
MCV: 98.4 fL (ref 78.0–100.0)
MCV: 98 fL (ref 78.0–100.0)
Platelets: 311 10*3/uL (ref 150–400)
Platelets: 245 10*3/uL (ref 150–400)
Platelets: 258 10*3/uL (ref 150–400)
RBC: 3.39 MIL/uL — ABNORMAL LOW (ref 3.87–5.11)
RBC: 3.08 MIL/uL — ABNORMAL LOW (ref 3.87–5.11)
RBC: 3 MIL/uL — ABNORMAL LOW (ref 3.87–5.11)
RDW: 16.4 % — ABNORMAL HIGH (ref 11.5–15.5)
RDW: 16.2 % — ABNORMAL HIGH (ref 11.5–15.5)
RDW: 16.1 % — ABNORMAL HIGH (ref 11.5–15.5)
WBC: 14.4 10*3/uL — ABNORMAL HIGH (ref 4.0–10.5)
WBC: 10.3 10*3/uL (ref 4.0–10.5)
WBC: 10.2 10*3/uL (ref 4.0–10.5)

## 2010-03-04 LAB — HEPARIN LEVEL (UNFRACTIONATED)
Heparin Unfractionated: <0.1 IU/mL — ABNORMAL LOW (ref 0.30–0.70)
Heparin Unfractionated: <0.1 IU/mL — ABNORMAL LOW (ref 0.30–0.70)

## 2010-03-04 LAB — APTT: aPTT: 46 seconds — ABNORMAL HIGH (ref 24–37)

## 2010-03-04 LAB — MRSA PCR SCREENING: MRSA by PCR: NEGATIVE

## 2010-03-05 NOTE — Progress Notes (Signed)
NAMEMarland Mason  [name] Mason, STANG NO.:  [id number]  MEDICAL RECORD NO.:  [id number]          PATIENT TYPE:  INP  LOCATION:  2312                         FACILITY:  [facility]  PHYSICIAN:  Jeoffrey Massed, MD    DATE OF BIRTH:  [birth year]                                PROGRESS NOTE   ADMITTING PHYSICIAN: Kela Millin, MD with [facility].  PRIMARY CARE PHYSICIAN: Holley Bouche, MD.  CONSULTANTS THIS ADMISSION: Garnetta Buddy, MD with Nephrology and Coralyn Helling, MD with Pulmonary Critical Care Medicine.  CHIEF COMPLAINT/REASON FOR ADMISSION: [name] is a 75 year old female patient well known to [place] from prior hospitalizations.  She was readmitted for progressive nausea, vomiting as well as shortness of breath.  She did not have any fever, but was found to have leukocytosis in the 22,000 range.  In addition, she was found to have the cuff of her hemodialysis catheter exposed.  Urinalysis was abnormal at admission.  Blood cultures were pending.  She also has a right chronic pleural effusion which is stable.  PHYSICAL EXAMINATION: VITAL SIGNS:  On initial exam, the patient had a low-grade temperature of 99.2, her BP was 117/60, heart rate 110, respirations 20, sat 99% on 2 L. OVERALL APPEARANCE:  She was lethargic and quite ill appearing, but no acute distress.  She was easily arousable. PULMONARY:  Diminished breath sounds throughout, but no rhonchi, wheezes, or crackles. CARDIOVASCULAR:  Some minor lower extremity edema, but no JVD.  No evidence of heart failure.  LABORATORY DATA: Her initial laboratory values reveal a sodium of 129, potassium 3.5, chloride 98, CO2 of 25, BUN 79, creatinine 5.3, glucose 252.  White count 22,500, hemoglobin 12.2, hematocrit 39.9, platelets 351,000, neutrophils 90%, absolute neutrophil count 20.3.  PT 22.3, INR 1.94, PTT 30.  Cardiac isoenzymes were negative x1 check.  Lactic acid was elevated at 3.7.  Procalcitonin was  mildly elevated at 0.95.  Urinalysis appeared consistent with UTI with large leukocytes, large blood, 15 ketones, many yeasts, few bacteria, 11-20 rbc's and wbc's too numerous to count.  PAST MEDICAL HISTORY: 1. Chronic kidney disease on dialysis Monday, Wednesday, and Fridays. 2. Chronic right-sided pleural effusion, multifactorial due to protein-     calorie malnutrition and recurrent volume overload. 3. Severe protein-calorie malnutrition due to chronic nausea status     post gastrostomy tube placement by Interventional Radiology on     January 07, 2010. 4. History of recurrent episodes of ventilator-dependent respiratory     failure due to hypercarbic respiratory failure, multifactorial due     to oversedation and overall generalized weakness and     deconditioning. 5. History of right middle lobe aspiration pneumonia.  Serratia     positive last admission December 2011. 6. Chronic atrial fibrillation on amiodarone and Coumadin. 7. Sick sinus syndrome prior to pacemaker placement. 8. Anemia of chronic disease. 9. Profound weakness and deconditioning. 10.History of herpes zoster. 11.History of gouty arthritis. 12.History of stage I basilic vein dialysis access on October 2011. 13.Diabetes.  ADMITTING DIAGNOSES: 1. Shortness of breath and weakness, acute on chronic. 2. Suspected sepsis syndrome.  Etiology  includes possible urinary     tract infection versus bacteremia from dialysis catheter access. 3. Leukocytosis secondary to suspected sepsis syndrome. 4. Hyponatremia in chronic kidney disease patient. 5. Chronic kidney disease on hemodialysis Monday, Wednesday, Friday. 6. Chronic atrial fibrillation. 7. Systemic anticoagulation with Coumadin. 8. Diabetes on Lantus.  DIAGNOSTICS: 1. Portable chest x-ray on 03/10/2010, that showed cardiomegaly     and probable mild interstitial edema with associated low lung     volumes and bibasilar atelectasis. 2. Two-view chest  x-ray on February 27, 2010, that shows no interval     change. 3. Portable chest x-ray on March 02, 2010, that shows interval     increase in bilateral airspace density, question edema versus     bilateral pneumonia. 4. Portable chest x-ray on March 03, 2010, that shows improved     bilateral airspace disease.  LABORATORY DATA: At admission, her BNP was 146.  Her cardiac isoenzymes were cycled and only showed a mild bump in her troponin of 0.07 consistent with chronic kidney disease.  Her MRSA PCR screening was negative.  Blood cultures from 03/10/10 showed no growth to date.  Hemoglobin A1c 6.1. Urine culture was negative.  Lactic acid on February 28, 2010, 0.9. Procalcitonin 28.01.  Prealbumin 13.4.  Stool for C. diff by PCR was positive.  ABG on March 02, 2010, pre-BiPAP on 3 L oxygen; pH 7.142, pCO2 of 69.4, pO2 of 61.8, bicarb 22.7, TCO2 of 24.9, base deficit 5.1, and sat 89.5%.  Repeat ABG after placement on BiPAP with 100% FIO2 with 6 of PEEP and 16 of pressure support, pH 7.141, pCO2 of 70.3, pO2 of 75, bicarbonate 23, TCO2 of 25.2, base deficit 4.8.  As of today, March 02, 2010, white count is 10,200, hemoglobin 8.8, hematocrit 29.4, platelets 258,000.  INR 1.74, PT 20.5.  HOSPITAL COURSE: 1. Sepsis syndrome and leukocytosis.  Initially, the hemodialysis     catheter was suspected as a primary source along with possible     urinary tract infection.  Subsequent urine cultures were negative     and blood cultures to date had no growth.  These were obtained on     Mar 10, 2010.  The patient had been placed on empiric Zosyn and     vancomycin due to suspected bacteremia and/or urinary tract     infection.  As of today, March 03, 2010, the Zosyn will be     discontinued since it has been 5 days and no growth in her     cultures.  Her white count is normal and she has underlying C. diff     colitis which is most likely the etiology of her sepsis syndrome.      Early in the hospitalization, the patient did have soft blood     pressures and required very low-dose dopamine which is now off. 2. C. diff colitis.  The patient presented with reports of chronic     diarrhea since initiation of her tube feedings.  Within 48 hours     after admission, her diarrhea became watery.  She had been     empirically placed on Flagyl.  Stool for C. diff PCR was positive     and pharmacy recommended that since she presented with a white     count greater than 15,000 that the Flagyl be changed to oral     vancomycin, which was done.  As previously mentioned, the Zosyn     has  been discontinued on March 03, 2010, due to underlying C.     diff and no evidence of bacterial infection either in the urine or     the blood stream. 3. Chronic kidney disease on hemodialysis.  The patient presented with     Vas-Cath cuff exposed.  She had that Vas-Cath removed within 24     hours after admission and subsequently underwent a hemodialysis     catheter replacement in the OR on March 02, 2010.  Because of     volume overload, she underwent urgent hemodialysis that same     evening and is stable.  Please note, she has only undergone the     first stage of her basilic vein access, this was in October 2011. 4. Acute on chronic respiratory failure with dyspnea, multifactorial.     The patient has chronic respiratory failure that is primarily     mediated by oversedation and weakness and typically presents with a     hypercarbic component.  This is exacerbated by chronic right     pleural effusion and issues related to easily shifting volume and     volume overload and heart failure related to this.     She had an episode of volume overload on March 02, 2010,     postoperatively and required BiPAP and dialysis as primary     interventions.  She was able to avoid intubation.  Pulmonary     Critical Care Medicine saw the patient in evaluation and as of     today, she is stable  from a respiratory standpoint.  This was her     only reason for staying in the step-down unit and therefore, she is     stable to transfer out of the step-down unit. 5. Acute on chronic nausea and vomiting.  This is additionally     multifactorial, more acute issue this admission, most likely     related to C. diff and dehydration.  Last admission, she had a     gastrostomy tube placed and undergoes nocturnal tube feedings from     8 p.m. to 8 a.m. and is tolerating the liquid diet better than the     solid diet.  She is normally followed by Dr. Jess [facility] at [facility].  She last saw him on February 26, 2010, the date of admission.  This admission, she had worsened     nausea and I am increasing her Zofran from q.6 to q.4 h., giving     it on schedule for at least 48 hours and then changing to p.r.n.     status. 6. Hyponatremia.  This was a problem at presentation, more likely     related to the chronic diarrhea.  This is now resolved. 7. Multifactorial acidosis, metabolic with a respiratory component.     Yesterday's ABG showed the patient with low pH of 7.1.  This is     multifactorial secondary to chronic hypercarbic respiratory failure as well     as ongoing diarrheal/GI losses.  The patient is clinically improving. 8. Protein-calorie malnutrition secondary to gastroparesis.  As     diarrhea resolves, we will begin to reinitiate her nocturnal tube     feedings.  Last night, the patient's husband reports that tube     feedings "ran right through her", so we will continue to hold tube     feedings an additional  24 hours. 9. Diabetes 2.  The patient's CBGs have remained stable this     admission, mainly in the 89-184 range in the past 24 hours.  She is     on Lantus and sliding scale insulin.  Her hemoglobin A1c was less     than 7. 10.Hypertension.  The patient has actually had a relative hypotension     this admission.  Hold parameters are in  place in regards to adminstration      of her usual medications. 11.Atrial fibrillation on Coumadin.  Atrial fib has remained     relatively rate controlled.  She did have supratherapeutic      INR before the hemodialysis catheter access was replaced that     required reversal with FFP. She is subtherapeutic.  She is on IV heparin     and pharmacy is dosing.  DISPOSITION: At the present time, the patient is appropriate to transfer to the 6700 unit with telemetry.     Allison L. Rennis Harding, N.P.   ______________________________ Jeoffrey Massed, MD    ALE/MEDQ  D:  03/03/2010  T:  03/03/2010  Job:  [id number]  Electronically Signed by Junious Silk N.P. on 03/03/2010 01:16:14 PM Electronically Signed by Jeoffrey Massed  on 03/05/2010 04:42:37 PM

## 2010-03-07 ENCOUNTER — Encounter: Payer: Self-pay | Admitting: Gastroenterology

## 2010-03-08 ENCOUNTER — Encounter: Payer: Self-pay | Admitting: Nephrology

## 2010-03-09 LAB — RENAL FUNCTION PANEL
Albumin: 2.3 g/dL — ABNORMAL LOW (ref 3.5–5.2)
Albumin: 2.4 g/dL — ABNORMAL LOW (ref 3.5–5.2)
CO2: 26 mEq/L (ref 19–32)
CO2: 28 mEq/L (ref 19–32)
Calcium: 9.6 mg/dL (ref 8.4–10.5)
Chloride: 99 mEq/L (ref 96–112)
Chloride: 98 mEq/L (ref 96–112)
Creatinine, Ser: 2.03 mg/dL — ABNORMAL HIGH (ref 0.4–1.2)
GFR calc Af Amer: 31 mL/min — ABNORMAL LOW (ref >60)
GFR calc Af Amer: 29 mL/min — ABNORMAL LOW (ref >60)
GFR calc Af Amer: 17 mL/min — ABNORMAL LOW (ref >60)
GFR calc non Af Amer: 26 mL/min — ABNORMAL LOW (ref >60)
GFR calc non Af Amer: 24 mL/min — ABNORMAL LOW (ref >60)
GFR calc non Af Amer: 14 mL/min — ABNORMAL LOW (ref >60)
Glucose, Bld: 176 mg/dL — ABNORMAL HIGH (ref 70–99)
Phosphorus: 4.1 mg/dL (ref 2.3–4.6)
Potassium: 3.5 mEq/L (ref 3.5–5.1)
Potassium: 3.2 mEq/L — ABNORMAL LOW (ref 3.5–5.1)
Sodium: 137 mEq/L (ref 135–145)
Sodium: 136 mEq/L (ref 135–145)
Sodium: 133 mEq/L — ABNORMAL LOW (ref 135–145)

## 2010-03-09 LAB — HEPARIN LEVEL (UNFRACTIONATED)
Heparin Unfractionated: 0.27 IU/mL — ABNORMAL LOW (ref 0.30–0.70)
Heparin Unfractionated: 0.23 IU/mL — ABNORMAL LOW (ref 0.30–0.70)
Heparin Unfractionated: 0.8 IU/mL — ABNORMAL HIGH (ref 0.30–0.70)

## 2010-03-09 LAB — CBC
HCT: 35 % — ABNORMAL LOW (ref 36.0–46.0)
HCT: 34.9 % — ABNORMAL LOW (ref 36.0–46.0)
HCT: 30.7 % — ABNORMAL LOW (ref 36.0–46.0)
Hemoglobin: 9.9 g/dL — ABNORMAL LOW (ref 12.0–15.0)
MCH: 28.6 pg (ref 26.0–34.0)
MCHC: 28 g/dL — ABNORMAL LOW (ref 30.0–36.0)
MCHC: 28.3 g/dL — ABNORMAL LOW (ref 30.0–36.0)
MCV: 101.7 fL — ABNORMAL HIGH (ref 78.0–100.0)
MCV: 102 fL — ABNORMAL HIGH (ref 78.0–100.0)
MCV: 101.2 fL — ABNORMAL HIGH (ref 78.0–100.0)
Platelets: 261 10*3/uL (ref 150–400)
Platelets: 250 10*3/uL (ref 150–400)
RBC: 3.25 MIL/uL — ABNORMAL LOW (ref 3.87–5.11)
RDW: 16.3 % — ABNORMAL HIGH (ref 11.5–15.5)
RDW: 16 % — ABNORMAL HIGH (ref 11.5–15.5)
WBC: 9.9 10*3/uL (ref 4.0–10.5)
WBC: 11.4 10*3/uL — ABNORMAL HIGH (ref 4.0–10.5)
WBC: 8.3 10*3/uL (ref 4.0–10.5)

## 2010-03-09 LAB — GLUCOSE, CAPILLARY
Glucose-Capillary: 116 mg/dL — ABNORMAL HIGH (ref 70–99)
Glucose-Capillary: 82 mg/dL (ref 70–99)
Glucose-Capillary: 125 mg/dL — ABNORMAL HIGH (ref 70–99)
Glucose-Capillary: 133 mg/dL — ABNORMAL HIGH (ref 70–99)
Glucose-Capillary: 104 mg/dL — ABNORMAL HIGH (ref 70–99)
Glucose-Capillary: 112 mg/dL — ABNORMAL HIGH (ref 70–99)
Glucose-Capillary: 81 mg/dL (ref 70–99)
Glucose-Capillary: 142 mg/dL — ABNORMAL HIGH (ref 70–99)

## 2010-03-09 LAB — CULTURE, BLOOD (ROUTINE X 2)
Culture  Setup Time: 201201121740
Culture  Setup Time: 201201121740
Culture: NO GROWTH
Culture: NO GROWTH

## 2010-03-09 LAB — PROTIME-INR: INR: 1.3 (ref 0.00–1.49)

## 2010-03-09 LAB — VANCOMYCIN, RANDOM: Vancomycin Rm: 20 ug/mL

## 2010-03-10 LAB — CBC
HCT: 34.5 % — ABNORMAL LOW (ref 36.0–46.0)
HCT: 31.1 % — ABNORMAL LOW (ref 36.0–46.0)
HCT: 30.3 % — ABNORMAL LOW (ref 36.0–46.0)
Hemoglobin: 9.9 g/dL — ABNORMAL LOW (ref 12.0–15.0)
Hemoglobin: 8.6 g/dL — ABNORMAL LOW (ref 12.0–15.0)
MCH: 29 pg (ref 26.0–34.0)
MCV: 102 fL — ABNORMAL HIGH (ref 78.0–100.0)
Platelets: 256 10*3/uL (ref 150–400)
RBC: 3.42 MIL/uL — ABNORMAL LOW (ref 3.87–5.11)
RBC: 2.97 MIL/uL — ABNORMAL LOW (ref 3.87–5.11)
RDW: 15.9 % — ABNORMAL HIGH (ref 11.5–15.5)
WBC: 8.7 10*3/uL (ref 4.0–10.5)
WBC: 13.6 10*3/uL — ABNORMAL HIGH (ref 4.0–10.5)
WBC: 14.9 10*3/uL — ABNORMAL HIGH (ref 4.0–10.5)

## 2010-03-10 LAB — RENAL FUNCTION PANEL
CO2: 25 mEq/L (ref 19–32)
GFR calc Af Amer: 26 mL/min — ABNORMAL LOW (ref >60)
GFR calc non Af Amer: 21 mL/min — ABNORMAL LOW (ref >60)
Glucose, Bld: 142 mg/dL — ABNORMAL HIGH (ref 70–99)
Potassium: 4 mEq/L (ref 3.5–5.1)
Sodium: 135 mEq/L (ref 135–145)

## 2010-03-10 LAB — GLUCOSE, CAPILLARY
Glucose-Capillary: 162 mg/dL — ABNORMAL HIGH (ref 70–99)
Glucose-Capillary: 158 mg/dL — ABNORMAL HIGH (ref 70–99)
Glucose-Capillary: 169 mg/dL — ABNORMAL HIGH (ref 70–99)
Glucose-Capillary: 197 mg/dL — ABNORMAL HIGH (ref 70–99)
Glucose-Capillary: 131 mg/dL — ABNORMAL HIGH (ref 70–99)
Glucose-Capillary: 108 mg/dL — ABNORMAL HIGH (ref 70–99)
Glucose-Capillary: 124 mg/dL — ABNORMAL HIGH (ref 70–99)

## 2010-03-10 LAB — URINALYSIS, MICROSCOPIC ONLY
Specific Gravity, Urine: 1.016 (ref 1.005–1.030)
Urine Glucose, Fasting: NEGATIVE mg/dL
Urobilinogen, UA: 0.2 mg/dL (ref 0.0–1.0)

## 2010-03-10 LAB — COMPREHENSIVE METABOLIC PANEL
AST: 13 U/L (ref 0–37)
Albumin: 2.4 g/dL — ABNORMAL LOW (ref 3.5–5.2)
Alkaline Phosphatase: 84 U/L (ref 39–117)
CO2: 27 mEq/L (ref 19–32)
Chloride: 99 mEq/L (ref 96–112)
Creatinine, Ser: 4.34 mg/dL — ABNORMAL HIGH (ref 0.4–1.2)
GFR calc Af Amer: 12 mL/min — ABNORMAL LOW (ref >60)
GFR calc non Af Amer: 10 mL/min — ABNORMAL LOW (ref >60)
Potassium: 4.3 mEq/L (ref 3.5–5.1)
Total Bilirubin: 0.4 mg/dL (ref 0.3–1.2)

## 2010-03-10 LAB — HEPARIN LEVEL (UNFRACTIONATED)
Heparin Unfractionated: 0.82 IU/mL — ABNORMAL HIGH (ref 0.30–0.70)
Heparin Unfractionated: 0.42 IU/mL (ref 0.30–0.70)
Heparin Unfractionated: 0.49 IU/mL (ref 0.30–0.70)

## 2010-03-10 LAB — PROTIME-INR
INR: 1.05 (ref 0.00–1.49)
Prothrombin Time: 15.3 seconds — ABNORMAL HIGH (ref 11.6–15.2)
Prothrombin Time: 13.9 seconds (ref 11.6–15.2)

## 2010-03-10 LAB — CLOSTRIDIUM DIFFICILE BY PCR: Toxigenic C. Difficile by PCR: NEGATIVE

## 2010-03-11 LAB — POCT I-STAT 3, ART BLOOD GAS (G3+)
Acid-base deficit: 2 mmol/L (ref 0.0–2.0)
Acid-base deficit: 1 mmol/L (ref 0.0–2.0)
Bicarbonate: 31.2 mEq/L — ABNORMAL HIGH (ref 20.0–24.0)
Bicarbonate: 29.8 mEq/L — ABNORMAL HIGH (ref 20.0–24.0)
O2 Saturation: 89 %
Patient temperature: 97.8
pO2, Arterial: 84 mmHg (ref 80.0–100.0)
pO2, Arterial: 88 mmHg (ref 80.0–100.0)

## 2010-03-11 LAB — CBC
HCT: 31.5 % — ABNORMAL LOW (ref 36.0–46.0)
Hemoglobin: 9.3 g/dL — ABNORMAL LOW (ref 12.0–15.0)
Hemoglobin: 9 g/dL — ABNORMAL LOW (ref 12.0–15.0)
MCH: 29.2 pg (ref 26.0–34.0)
MCHC: 28.6 g/dL — ABNORMAL LOW (ref 30.0–36.0)
MCHC: 28 g/dL — ABNORMAL LOW (ref 30.0–36.0)
MCV: 104.1 fL — ABNORMAL HIGH (ref 78.0–100.0)
Platelets: 237 10*3/uL (ref 150–400)
RBC: 3.19 MIL/uL — ABNORMAL LOW (ref 3.87–5.11)
RBC: 3.03 MIL/uL — ABNORMAL LOW (ref 3.87–5.11)
RDW: 16.5 % — ABNORMAL HIGH (ref 11.5–15.5)
WBC: 13.9 10*3/uL — ABNORMAL HIGH (ref 4.0–10.5)
WBC: 21.5 10*3/uL — ABNORMAL HIGH (ref 4.0–10.5)

## 2010-03-11 LAB — HEPARIN LEVEL (UNFRACTIONATED)
Heparin Unfractionated: 0.18 IU/mL — ABNORMAL LOW (ref 0.30–0.70)
Heparin Unfractionated: 0.33 IU/mL (ref 0.30–0.70)

## 2010-03-11 LAB — COMPREHENSIVE METABOLIC PANEL
ALT: <8 U/L (ref 0–35)
Alkaline Phosphatase: 99 U/L (ref 39–117)
CO2: 27 mEq/L (ref 19–32)
Chloride: 95 mEq/L — ABNORMAL LOW (ref 96–112)
GFR calc non Af Amer: 12 mL/min — ABNORMAL LOW (ref >60)
Glucose, Bld: 218 mg/dL — ABNORMAL HIGH (ref 70–99)
Potassium: 4.1 mEq/L (ref 3.5–5.1)
Sodium: 130 mEq/L — ABNORMAL LOW (ref 135–145)
Total Bilirubin: 0.5 mg/dL (ref 0.3–1.2)
Total Protein: 6 g/dL (ref 6.0–8.3)

## 2010-03-11 LAB — GLUCOSE, CAPILLARY
Glucose-Capillary: 153 mg/dL — ABNORMAL HIGH (ref 70–99)
Glucose-Capillary: 163 mg/dL — ABNORMAL HIGH (ref 70–99)
Glucose-Capillary: 112 mg/dL — ABNORMAL HIGH (ref 70–99)
Glucose-Capillary: 158 mg/dL — ABNORMAL HIGH (ref 70–99)
Glucose-Capillary: 188 mg/dL — ABNORMAL HIGH (ref 70–99)

## 2010-03-11 LAB — MRSA PCR SCREENING: MRSA by PCR: NEGATIVE

## 2010-03-11 LAB — IRON AND TIBC
Saturation Ratios: 13 % — ABNORMAL LOW (ref 20–55)
UIBC: 195 ug/dL

## 2010-03-11 LAB — URINE CULTURE

## 2010-03-11 LAB — PROTIME-INR
INR: 1.02 (ref 0.00–1.49)
Prothrombin Time: 13.6 seconds (ref 11.6–15.2)

## 2010-03-11 LAB — CARDIAC PANEL(CRET KIN+CKTOT+MB+TROPI): Troponin I: 0.08 ng/mL — ABNORMAL HIGH (ref 0.00–0.06)

## 2010-03-11 LAB — PHOSPHORUS: Phosphorus: 6.4 mg/dL — ABNORMAL HIGH (ref 2.3–4.6)

## 2010-03-12 LAB — GLUCOSE, CAPILLARY
Glucose-Capillary: 106 mg/dL — ABNORMAL HIGH (ref 70–99)
Glucose-Capillary: 145 mg/dL — ABNORMAL HIGH (ref 70–99)
Glucose-Capillary: 165 mg/dL — ABNORMAL HIGH (ref 70–99)
Glucose-Capillary: 240 mg/dL — ABNORMAL HIGH (ref 70–99)

## 2010-03-12 LAB — HEPARIN LEVEL (UNFRACTIONATED): Heparin Unfractionated: 0.15 IU/mL — ABNORMAL LOW (ref 0.30–0.70)

## 2010-03-12 LAB — POCT I-STAT 3, ART BLOOD GAS (G3+)
Bicarbonate: 33.7 mEq/L — ABNORMAL HIGH (ref 20.0–24.0)
O2 Saturation: 92 %
TCO2: 36 mmol/L (ref 0–100)
pCO2 arterial: 85.6 mmHg (ref 35.0–45.0)
pO2, Arterial: 75 mmHg — ABNORMAL LOW (ref 80.0–100.0)

## 2010-03-12 NOTE — Progress Notes (Addendum)
[name], [name] NO.:  [id number]  MEDICAL RECORD NO.:  [id number]          PATIENT TYPE:  INP  LOCATION:  6738                         FACILITY:  [facility]  PHYSICIAN:  Hollice Espy, M.D.DATE OF BIRTH:  1935/05/01                                PROGRESS NOTE   This summary covers events from January 12 to March 10, 2010.  PRIMARY CARE PHYSICIAN: Holley Bouche, MD of [facility].  CONSULTANTS ON THIS CASE: Elvis Coil, MD, Nephrology. Coralyn Helling, MD, Pulmonary Critical Care Medicine.  HOSPITAL COURSE: The patient is a 75 year old white female with extensive past medical history including end-stage renal disease on dialysis, AFib, CHF, hypertension, severe gastroparesis status post a PEG tube, who presented with shortness of breath on February 26, 2010.  At that time, she was evaluated, found to show signs of sepsis as well.  [facility] were consulted for nephrology issues.  The patient was found to have a leukocytosis of 22,000 and she was found to have the cuff of her hemodialysis catheter exposed.  Urinalysis was also abnormal at admission.  The patient was admitted.  Initially her hemodialysis catheter was suspected to be the primary source along with possible UTI. Subsequent urine cultures were negative.  Blood cultures showed no growth to date.  These were done on admission.  The patient was placed initially on empiric Zosyn and vancomycin due to suspected bacteremia and/or UTI.  As of March 03, 2010, Zosyn was discontinued after 5 days.  White count normal but cultures were positive for C. diff which is felt to be likely etiology of her sepsis syndrome.  The reason for C. diff suspicion was that 48 hours after admission the patient's diarrhea became watery.  She was placed on p.o. Flagyl and pharmacy recommended as the patient presented with a high white count, greater than 15,000 prior to be changed to oral  vancomycin which was done.  Since that time, the patient has remained on p.o. vancomycin.  As of March 10, 2010, she has completed day 8 of a 14-day therapy of p.o. vancomycin.  Repeat C. diff PCR has been negative.  With regard to the patient's end-stage renal disease, the patient presented with an exposed Vas Cath cuff. This was removed within 24 hours after admission.  She subsequently underwent a hemodialysis catheter replacement in the OR on March 02, 2010.  Her shortness of breath was felt to be not from a pneumonia but from volume overload.  She underwent emergent hemodialysis that same evening and has remained stable.  Please note, the patient has only undergone the first stage of a basilic vein access done October 2012. In regard to some acute on chronic respiratory failure with dyspnea, this is felt to be multifactorial.  The patient has chronic respiratory failure probably mediated by oversedation, weakness, typically presents a hypercarbic component exacerbated by right pleural effusion.  With BiPAP, dialysis, the patient was able to avoid intubation.  Pulmonary Critical Care was following the patient.  When she was stabilized, she was moved from the Critical Care Service to Hospitalist Service.  In regard  to the patient's chronic nausea, vomiting, the patient has had episodes of severe gastroparesis and has had an extensive outpatient GI workup.  She initially had been seen by [facility] and after extensive evaluation they felt that they could not contribute any further to treatment causes for this.  The patient underwent a PEG tube insertion and has been on tube feeds but with the ability to take oral p.o.  She had planned to follow up with Dr. Lina Sar as an outpatient and with [facility] prior to this hospitalization for further evaluation and input.  It was felt that during her hospitalization she had multifactorial reasons for increased nausea including  her C. diff colitis and her medication treatment for this.  Her tube feeds at one point were stopped.  She was evaluated and her PEG tube was found to have some signs of obstruction and the tube feeds were not passing through.  The patient's tube feeds were held on March 07, 2010. Interventional Radiology was consulted who saw the patient and were able to successfully clear her PEG tube rather than having to exchange it. At this point then tube feeds were restarted.  The patient initially tolerated this, but then as of January 22 started complaining of increased nausea.  She was noted to also have an increasing leukocytosis.  She was reevaluated and found to have a large UTI with yeast.  The patient was started on Diflucan orally given through her tube as well as IV Rocephin.  Her white blood cell count peaked at 14 on January 23 and came down to 13 on January 24.  She also was noting some increased cough and plans to recheck a chest x-ray on January 24.  As discussed extensively with the patient, we may not be able to successfully cure her nausea while she is in the hospital.  The plan will be to treat as many of the acute issues that maybe confound  her overall workup for chronic nausea and once these acute issues are better treated, she can be discharged to home in a stable state.  Will follow up with Dr. Lina Sar for her chronic nausea.  In regard to her hyponatremia, this was the problem at presentation, more likely related to chronic diarrhea, now resolved.  In regard to protein-calorie malnutrition, secondary to gastroparesis, again she is on tube feeds. In regard to her diabetes mellitus, she has remained relatively stable during this admission.  A1c was less than 7.  With regard to her hypertension, she has been relatively hypotensive due to the initial sepsis and that has been stabilized.  In regard to her atrial fibrillation on Coumadin, the patient's AFib was rate  controlled.  The patient had supratherapeutic INR before hemodialysis catheter was replaced, required reversal with FFP.  The patient is subtherapeutic. Since that time, she has been receiving IV heparin.  Given now that her tube feeds have been able to be restarted through her re-opened PEG, we were going to restart her Coumadin as per pharmacy.  In regard to increased nausea, we will go ahead and have tried Marinol in addition to treating any new infections.  The overall plan for this patient as of January 24 prior to discharge: 1. Ensuring that her CBC has normalized. 2. Awaiting urine cultures. 3. Following up on chest x-ray with the patient's new cough to ensure     that she is not having any signs of new pneumonia or aspiration as     she  has had a number of vomiting episodes since her     hospitalization. 4. Making sure her INR is therapeutic given her history of AFib.     Hollice Espy, M.D.     SKK/MEDQ  D:  03/10/2010  T:  03/10/2010  Job:  [id number]  Electronically Signed by Virginia Rochester M.D. on 03/12/2010 08:10:38 AM

## 2010-03-12 NOTE — H&P (Addendum)
NAMECORTNIE, RINGEL NO.:  [id number]  MEDICAL RECORD NO.:  [id number]          PATIENT TYPE:  INP  LOCATION:  1824                         FACILITY:  [facility]  PHYSICIAN:  Kela Millin, M.D.DATE OF BIRTH:  September 17, 1935  DATE OF ADMISSION:  02/20/2010 DATE OF DISCHARGE:                             HISTORY & PHYSICAL   PRIMARY CARE PROVIDER:  Holley Bouche, M.D.  The patient is being admitted triad hospitalist, [facility] Team #1.  CHIEF COMPLAINT:  Shortness of breath.  HISTORY OF PRESENT ILLNESS:  [name] is a 75 year old female with multiple medical problems including chronic nausea; vomiting; end-stage renal disease on Monday, Wednesday, and Friday dialysis; Afib; CHF; hypertension; diabetes; and gastritis, who presents to the [facility] ED with a chief complaint of shortness of breath.  Information was obtained from the patient and the husband, who is at the bedside.  The patient began experiencing increased shortness of breath over the last 2-3 days.  The patient wears oxygen at home and ambulates with a walker.  Over the last 2-3 days, she has developed exertional shortness of breath worsening and shortness of breath at rest.  Associated symptoms include nausea and vomiting, which is somewhat of a chronic issue for her.  Of note, she has a feeding tube that she got in August 2011 secondary to her inability to take anything by mouth due to nausea and vomiting.  She gets most of her nutrition through the G-tube.  She does take meds and drink some fluids p.o.  She has also experienced some recent head congestion with her shortness of breath.  She states that she went to a GI specialist at [facility] yesterday as a followup appointment to her GI issues and therefore she missed dialysis.  This morning she was so weak that she could not even get up.  She also experienced chills and more vomiting than usual with small amount of green emesis.  No coffee- ground  emesis.  She denies any headache, chest pain, abdominal pain, diarrhea, dysuria, or hematuria.  Workup in the ED yields a white count of 22.5, lactic acid level of 3.7, and heart rate 124.  We are asked to admit for further evaluation and treatment.  ALLERGIES: 1. CODEINE. 2. REGLAN. 3. HYDROCODONE. 4. VICODIN.  PAST MEDICAL HISTORY: 1. History of bilateral hip wound, status post debridement and wound     VAC. 2. Gastroesophageal reflux disease. 3. History of hypercarbic respiratory failure. 4. Shingles. 5. Klebsiella and Citrobacter UTI. 6. Diabetes mellitus. 7. End-stage renal disease, on dialysis Monday, Wednesday, and Friday     at [facility]. 8. Anemia of chronic disease. 9. Thrombocytopenia. 10.Afib, on chronic Coumadin therapy. 11.Hypertension. 12.Hyperlipidemia. 13.Chronic venous insufficiency. 14.Diabetic peripheral neuropathy. 15.Sick sinus syndrome with a history of 5-second pauses, status post     pacemaker. 16.Gout.  PAST SURGICAL HISTORY: 1. Cholecystectomy. 2. Ventral hernia surgery x3. 3. Excisional right periocular cyst with intermediate closure. 4. I and D of issue a rectal abscess.  FAMILY MEDICAL HISTORY:  Positive for coronary artery disease.  SOCIAL HISTORY:  The patient is married, lives with  her husband.  She is retired.  Denies tobacco use.  Denies alcohol use.  Denies IV drug use.  MEDICATIONS: 1. Zofran 4 mg p.o. q.6 h. as needed for nausea and vomiting. 2. Metoprolol 25 mg p.o. b.i.d. 3. Alprazolam 0.5 mg p.o. q.4 h. as needed for anxiety. 4. Tramadol 50 mg p.o. q.6 h. as needed for pain. 5. Allopurinol 100 mg p.o. daily. 6. Simvastatin 40 mg p.o. daily. 7. Multivitamin 1 tablet p.o. daily. 8. Protonix 80 mg p.o. daily. 9. Nephro-Vite 1 tablet p.o. daily. 10.Sevelamer 2 tablets p.o. t.i.d. with meals. 11.Lantus 30 units subcu daily. 12.NovoLog FlexPen 1-9 units subcu t.i.d. 13.Colace 100 mg p.o. daily. 14.PhosLo 667 three tablets  t.i.d. with meals. 15.Coumadin 1 mg p.o. daily.  REVIEW OF SYSTEMS:  GENERAL:  Positive for chills and subjective fever. ENT:  Positive for nasal congestion.  Negative for ear pain or  sore throat.  CV:  Negative chest pain, palpitation, or lower extremity edema.  RESPIRATORY:  See HPI.  MUSCULOSKELETAL:  Negative for joint pain.  Positive for overall generalized weakness.  NEUROLOGIC:  Negative for headache, visual disturbances, numbness, or tingling of extremities: GI:  Positive for chronic nausea and vomiting.  Negative for abdominal pain, diarrhea, or  constipation.  GU:  Negative for dysuria, hematuria, frequency, or urgency.  PSYCH:  Negative for depression.  Positive for anxiety.  HEME:  Negative for any unusual bruising or bleeding.  LABORATORY DATA:  Sodium 129, potassium 3.5, chloride 98, CO2 of 25, BUN 79, creatinine 5.3, and glucose 252.  WBC is 22.5, hemoglobin 12.2, hematocrit 39.9, platelets 351,000, neutrophils 90%, and absolute neutrophils 20.3.  PT 22.3, INR 1.94, and PTT 30.  CK-MB 3.4, troponin one less than 0.05, myoglobin 220, lactic acid 3.7.  Procalcitonin 0.95. Urinalysis with large leukocytes, 30 protein, large blood, 15 ketones, small bili, turbid in appearance, many yeast, few bacteria, 11-20 rbc's, and wbc's too numerous to count.  RADIOLOGY STUDIES:  Chest x-ray, cardiomegaly and probable mild interstitial edema.  Low lung volumes with bibasilar atelectasis.  PHYSICAL EXAMINATION:  VITAL SIGNS:  T 99.2, blood pressure 117/60, heart rate 110, respirations 20, sats 99% on 2 L. GENERAL:  Awake but lethargic, ill appearing, no acute distress. HEAD:  Normocephalic, atraumatic.  Pupils are equal, round, and reactive to light.  Mucous membranes of her mouth pink and slightly dry.  No obvious lesion or exudate in nose or ears. NECK:  Supple.  No JVD.  Full range of motion.  No lymphadenopathy. CV:  Tachycardic.  No murmur, gallop, or rub.  No lower extremity  edema. RESPIRATORY:  Mild increased work of breathing.  Breath sounds clear, but distant bilaterally.  No rhonchi, wheezes, or rales. ABDOMEN:  Obese and soft.  Positive bowel sounds throughout.  Nontender to palpation. NEUROLOGIC:  Alert and oriented x3.  Speech clear.  Facial symmetry. MUSCULOSKELETAL:  Moves all extremities albeit slowly.  No joint swelling/erythema. EXTREMITIES:  Without clubbing or cyanosis.  ASSESSMENT AND PLAN: 1. Shortness of breath/weakness likely secondary to sepsis with     probable urinary tract infection source and possible dialysis     catheter source.  The patient is lethargic.  We will panculture.     We will admit to step-down.  We will place on vancomycin and Zosyn.     We will provide O2 support and monitor sats closely. 2. Leukocytosis, see #1.  We will recheck in the a.m. 3. Hyponatremia.  Chart review indicates currently at baseline at 129.  The patient did miss her dialysis yesterday.  We will request renal     consult for dialysis. 4. End-stage renal disease.  The patient is a daily Monday, Wednesday,     and Friday dialysis patient at [facility].  She missed her     dialysis yesterday.  We will request a renal consult. 5. Atrial fibrillation.  Currently, on Coumadin and metoprolol.  We     will request Coumadin per pharmacy.  We will continue metoprolol     with parameters. 6. Diabetes.  We will decrease her Lantus to 15 units each evening     given her decreased p.o. intake and use sliding scale glycemic     control. 7. DVT prophylaxis.  The patient is on Coumadin. 8. Code status.  The patient is full code.  This assessment and plan was discussed with Dr. Suanne Marker.     Gwenyth Bender, NP   ______________________________ Kela Millin, M.D.    KMB/MEDQ  D:  03/08/2010  T:  02/17/2010  Job:  [id number]  cc:   Holley Bouche, M.D.  Electronically Signed by Donnalee Curry M.D. on 03/12/2010 09:39:37 AM Electronically Signed by  Toya Smothers  on 04/12/2010 08:28:36 AM

## 2010-03-13 LAB — BASIC METABOLIC PANEL
BUN: 23 mg/dL (ref 6–23)
Chloride: 97 mEq/L (ref 96–112)
Creatinine, Ser: 3.27 mg/dL — ABNORMAL HIGH (ref 0.4–1.2)
GFR calc non Af Amer: 14 mL/min — ABNORMAL LOW (ref >60)
Glucose, Bld: 98 mg/dL (ref 70–99)
Potassium: 2.6 mEq/L — CL (ref 3.5–5.1)

## 2010-03-13 LAB — CBC
HCT: 26.9 % — ABNORMAL LOW (ref 36.0–46.0)
MCHC: 29.4 g/dL — ABNORMAL LOW (ref 30.0–36.0)
Platelets: 202 10*3/uL (ref 150–400)
RDW: 16.8 % — ABNORMAL HIGH (ref 11.5–15.5)
WBC: 10.7 10*3/uL — ABNORMAL HIGH (ref 4.0–10.5)

## 2010-03-13 LAB — PROTIME-INR
INR: 1.24 (ref 0.00–1.49)
Prothrombin Time: 15.8 seconds — ABNORMAL HIGH (ref 11.6–15.2)

## 2010-03-13 LAB — GLUCOSE, CAPILLARY
Glucose-Capillary: 111 mg/dL — ABNORMAL HIGH (ref 70–99)
Glucose-Capillary: 89 mg/dL (ref 70–99)
Glucose-Capillary: 75 mg/dL (ref 70–99)

## 2010-03-13 LAB — BRAIN NATRIURETIC PEPTIDE: Pro B Natriuretic peptide (BNP): 630 pg/mL — ABNORMAL HIGH (ref 0.0–100.0)

## 2010-03-14 LAB — CBC
HCT: 26.4 % — ABNORMAL LOW (ref 36.0–46.0)
Hemoglobin: 7.8 g/dL — ABNORMAL LOW (ref 12.0–15.0)
MCH: 29.7 pg (ref 26.0–34.0)
MCHC: 29.5 g/dL — ABNORMAL LOW (ref 30.0–36.0)
MCV: 100.4 fL — ABNORMAL HIGH (ref 78.0–100.0)
RDW: 17.1 % — ABNORMAL HIGH (ref 11.5–15.5)

## 2010-03-14 LAB — GLUCOSE, CAPILLARY
Glucose-Capillary: 81 mg/dL (ref 70–99)
Glucose-Capillary: 90 mg/dL (ref 70–99)
Glucose-Capillary: 116 mg/dL — ABNORMAL HIGH (ref 70–99)
Glucose-Capillary: 133 mg/dL — ABNORMAL HIGH (ref 70–99)

## 2010-03-14 LAB — CARDIAC PANEL(CRET KIN+CKTOT+MB+TROPI)
CK, MB: 7.9 ng/mL (ref 0.3–4.0)
CK, MB: 7.5 ng/mL (ref 0.3–4.0)
Total CK: 62 U/L (ref 7–177)
Total CK: 26 U/L (ref 7–177)

## 2010-03-14 LAB — BASIC METABOLIC PANEL
BUN: 7 mg/dL (ref 6–23)
CO2: 25 mEq/L (ref 19–32)
Calcium: 8.8 mg/dL (ref 8.4–10.5)
Creatinine, Ser: 1.64 mg/dL — ABNORMAL HIGH (ref 0.4–1.2)
GFR calc non Af Amer: 31 mL/min — ABNORMAL LOW (ref >60)
Glucose, Bld: 104 mg/dL — ABNORMAL HIGH (ref 70–99)

## 2010-03-14 LAB — PROTIME-INR: INR: 1.49 (ref 0.00–1.49)

## 2010-03-15 LAB — GLUCOSE, CAPILLARY
Glucose-Capillary: 84 mg/dL (ref 70–99)
Glucose-Capillary: 90 mg/dL (ref 70–99)

## 2010-03-15 LAB — CBC
HCT: 28.4 % — ABNORMAL LOW (ref 36.0–46.0)
Platelets: 184 10*3/uL (ref 150–400)
RDW: 17.6 % — ABNORMAL HIGH (ref 11.5–15.5)
WBC: 6.3 10*3/uL (ref 4.0–10.5)

## 2010-03-15 LAB — BASIC METABOLIC PANEL
GFR calc non Af Amer: 14 mL/min — ABNORMAL LOW (ref >60)
Potassium: 3.7 mEq/L (ref 3.5–5.1)
Sodium: 131 mEq/L — ABNORMAL LOW (ref 135–145)

## 2010-03-15 LAB — PROTIME-INR: Prothrombin Time: 27.1 seconds — ABNORMAL HIGH (ref 11.6–15.2)

## 2010-03-15 LAB — HEPATITIS B SURFACE ANTIGEN: Hepatitis B Surface Ag: NEGATIVE

## 2010-03-16 LAB — MAGNESIUM: Magnesium: 1.8 mg/dL (ref 1.5–2.5)

## 2010-03-16 LAB — CBC
MCH: 29.1 pg (ref 26.0–34.0)
MCHC: 27.4 g/dL — ABNORMAL LOW (ref 30.0–36.0)
MCV: 106.3 fL — ABNORMAL HIGH (ref 78.0–100.0)
Platelets: 189 10*3/uL (ref 150–400)
RDW: 18.2 % — ABNORMAL HIGH (ref 11.5–15.5)

## 2010-03-16 LAB — BASIC METABOLIC PANEL
BUN: 7 mg/dL (ref 6–23)
Chloride: 100 mEq/L (ref 96–112)
Glucose, Bld: 148 mg/dL — ABNORMAL HIGH (ref 70–99)
Potassium: 3.4 mEq/L — ABNORMAL LOW (ref 3.5–5.1)

## 2010-03-16 LAB — GLUCOSE, CAPILLARY
Glucose-Capillary: 126 mg/dL — ABNORMAL HIGH (ref 70–99)
Glucose-Capillary: 107 mg/dL — ABNORMAL HIGH (ref 70–99)
Glucose-Capillary: 104 mg/dL — ABNORMAL HIGH (ref 70–99)
Glucose-Capillary: 127 mg/dL — ABNORMAL HIGH (ref 70–99)

## 2010-03-17 LAB — PROTIME-INR
INR: 3.48 — ABNORMAL HIGH (ref 0.00–1.49)
Prothrombin Time: 35 seconds — ABNORMAL HIGH (ref 11.6–15.2)

## 2010-03-17 LAB — GLUCOSE, CAPILLARY: Glucose-Capillary: 94 mg/dL (ref 70–99)

## 2010-03-17 LAB — CULTURE, BLOOD (ROUTINE X 2): Culture: NO GROWTH

## 2010-03-18 ENCOUNTER — Inpatient Hospital Stay (HOSPITAL_COMMUNITY): Payer: Medicare Other

## 2010-03-18 DIAGNOSIS — J9600 Acute respiratory failure, unspecified whether with hypoxia or hypercapnia: Secondary | ICD-10-CM

## 2010-03-18 DIAGNOSIS — N186 End stage renal disease: Secondary | ICD-10-CM

## 2010-03-18 LAB — RENAL FUNCTION PANEL
Chloride: 100 mEq/L (ref 96–112)
Glucose, Bld: 138 mg/dL — ABNORMAL HIGH (ref 70–99)
Phosphorus: 7.3 mg/dL — ABNORMAL HIGH (ref 2.3–4.6)
Potassium: 4.7 mEq/L (ref 3.5–5.1)
Sodium: 137 mEq/L (ref 135–145)

## 2010-03-18 LAB — CBC
Hemoglobin: 9.6 g/dL — ABNORMAL LOW (ref 12.0–15.0)
MCV: 109.4 fL — ABNORMAL HIGH (ref 78.0–100.0)
Platelets: 184 10*3/uL (ref 150–400)
RBC: 3.29 MIL/uL — ABNORMAL LOW (ref 3.87–5.11)
WBC: 9 10*3/uL (ref 4.0–10.5)

## 2010-03-18 LAB — BLOOD GAS, ARTERIAL
Bicarbonate: 28.6 mEq/L — ABNORMAL HIGH (ref 20.0–24.0)
Patient temperature: 98.6
pCO2 arterial: 126 mmHg (ref 35.0–45.0)
pH, Arterial: 6.985 — CL (ref 7.350–7.400)

## 2010-03-18 LAB — PROTIME-INR: Prothrombin Time: 39.7 seconds — ABNORMAL HIGH (ref 11.6–15.2)

## 2010-03-18 LAB — GLUCOSE, CAPILLARY: Glucose-Capillary: 137 mg/dL — ABNORMAL HIGH (ref 70–99)

## 2010-03-18 DEATH — deceased

## 2010-03-19 DIAGNOSIS — A0472 Enterocolitis due to Clostridium difficile, not specified as recurrent: Secondary | ICD-10-CM

## 2010-04-06 NOTE — Consult Note (Signed)
[name] Mason, [name] Mason NO.:  [id number]  MEDICAL RECORD NO.:  [id number]          PATIENT TYPE:  INP  LOCATION:  2926                         FACILITY:  [facility]  PHYSICIAN:  Quinterious Walraven L. Ladona Ridgel, MD  DATE OF BIRTH:  06/06/1935  DATE OF CONSULTATION:  03/16/2010 DATE OF DISCHARGE:                                CONSULTATION   Consult is requested by Charlcie Cradle. Delford Field, MD, FCCP  REASON FOR CONSULTATION:  Reaffirm goals of care and related symptom management.  HISTORY OF PRESENT ILLNESS:  The patient is a 75 year old white female with advanced renal disease on dialysis.  The patient was admitted on February 26, 2010 with shortness of breath, found to have erosion of her dialysis catheter to the skin with possible line infection and sepsis. The patient was treated with Zosyn and vancomycin and subsequently became positive for C diff for which, she is being treated with p.o. vancomycin.  The patient had removal of a catheter placement.  The patient's course was complicated by acute on chronic respiratory failure, which was exacerbated by IV opiates.  The patient also has AFib, which is rate controlled at this time, chronic hypotension.  The patient required a long course of BiPAP and was able to wean off today after dialysis with the help of some oral Roxanol.  The patient met with me with her spouse, Fayrene Fearing; her sister, and four sons and a granddaughter.  REVIEW OF SYSTEMS:  GENERAL:  At this time, the patient feels "jittery." She denies blurred vision, double vision,  EAR, NOSE, AND THROAT:  No tinnitus, dysphagia, or discharge.  CARDIOVASCULAR:  No chest pain or palpitations.  RESPIRATORY:  Mild shortness of breath, but improved. GU:  No hesitancy, frequency, dysuria.  GI:  The patient does have a PEG tube.  She denies any abdominal discomfort.  NEUROLOGIC:  The patient is awake, alert, very clear and able to communicate with her family.  She appears to have the  capacity to make decisions for herself. Neurologically, the patient likely has issue with depression and anxiety.  PAST MEDICAL HISTORY: 1. Bilateral hip wound, status post debridement and VAC. 2. GERD. 3. Hypercarbic respiratory failure. 4. Shingles UTI. 5. End-stage renal disease. 6. Chronic anemia. 7. Thrombocytopenia. 8. Hypertension. 9. Hyperlipidemia. 10.Chronic venous insufficiency. 11.Atrial fibrillation. 12.Peripheral neuropathy. 13.Sick sinus syndrome. 14.Gout.  PAST SURGICAL HISTORY: 1. Cholecystectomy. 2. Ventral hernia repair. 3. Auricular cyst repair. 4. Rectal abscess I and D.  SOCIAL HISTORY:  The patient is married.  She does not smoke or drink alcohol.  She is generally treasured by her family, who describe her as the best mother.  FAMILY HISTORY:  Both parents had coronary artery disease.  Her current medications were reviewed.  She is currently on: 1. Xanax 0.25 p.o. q.12. 2. Aranesp IV on Friday with dialysis. 3. Erythromycin 250 mg IV q.8. 4. Fluconazole 100 mg q.24. 5. Flonase 2 sprays daily. 6. NovoLog.7. Iron IV. 8. Xopenex 1.25 inhaled q.4. 9. Metoprolol 5 mg IV q.8. 10.Flagyl 500 IV q.8. 11.ProAmatine 10 mg p.o. Monday, Wednesday, and Friday. 12.Protonix 40 mg IV b.i.d. 13.Nephro-Vite.  14.Crestor 10 mg daily. 15.Vancomycin 250 q.6. 16.Warfarin. 17.Calcium carbonate 500 q.6 p.r.n. 18.Hydroxyzine p.r.n. 19.Levalbuterol p.r.n. 20.Atrovent 0.5 p.r.n. 21.Zofran and Phenergan as well as Ultram q.6 p.r.n. for pain.  PHYSICAL EXAMINATION:  VITAL SIGNS:  The patient is afebrile.  Pulse was 107, blood pressure 107/63, respirations 18, saturation 92% on 3 liters. GENERAL:  This is a morbidly obese white female in no acute physical distress, but she is anxious. HEENT:  She is normocephalic, atraumatic.  Pupils equal, round, and reactive to light.  Extraocular muscles appear to be intact.  Mucous membranes are dry. NECK:  Supple. CHEST WALL:   She does have an anterior chest wall wound that is healing where they took out her port and replaced it. CHEST:  Decreased with some course wet rhonchi anteriorly at bases are decreased with no wheezing at this time. CARDIOVASCULAR:  Tachycardic, irregular.  Positive S1 and S2.  I do not appreciate murmur, rub, or gallop. ABDOMEN:  Obese, soft, nontender, nondistended.  She does have a PEG tube in place, which at this time is obstructed. EXTREMITIES:  Some edema and multiple areas of ecchymosis. NEUROLOGIC:  She is very anxious, but awake, alert, and with capacity.  Review of her laboratory results show a PT of 29.2 with an INR of 2.75. Sodium 137, potassium 3.4, chloride 100, CO2 of 28, BUN 7, creatinine 2.11, glucose of 148, magnesium was 1.8, phosphorus was 3.5.  This is a 75 year old white female who has been very ill for some time and has spoken with her pulmonologist and decided that she does not desire to be resuscitated or intubated.  At this time, I had the pleasure of meeting with her and her family to allow her to express her wishes as she progresses along the journey towards hopeful recovery, but if not at least symptom control.  The patient states that her overall goals are to be comfortable and she can no longer subject herself to this feeling of anxiety and dyspnea.  She states her biggest fear is that she is going to feel like she cannot breathe.  She states her biggest worry with regard to breathing is gasping for air.  Her family support her and her desire is to be comfortable and understand that in doing so that she may need to progress on to continuous opiates at some point and that she may become more somnolent and that there is a fine balance between the medications and her symptom management.  The patient states that we are unable to control her symptoms with Roxanol and benzodiazepines that she would prefer not to restart the BiPAP, but also would not want to  be conscious of the fact that she is not breathing well.  She states see if we needed to use the BiPAP in order to get her under control and then take her off, then she would be willing to allow the doctors to make their decision.  She would prefer not to go back on if possible, but if necessary to get her stabilized, she would utilize that too, but would understand that it needs to be removed when she is comfortable.  Anxiety:  Her big goal is not to feel jittery or like she is panicking. She currently has benzodiazepine q.12, but it appears that she likely needs a small dose in between.  The patient is willing to take the risk of complicating her respiratory status in order to be comfortable even if she relapses into respiratory failure.  She  states her goal is to go from this world in peace without the sensation of pain.  Dialysis:  She does not wish to stop this at this time.  She however recognizes if she becomes unconscious that she would expect that the physicians would stop that treatment, but at this time since she is able to enjoy her family, she does not want to electively stop dialysis.  Continue to treat with antibiotics and feed her via her PEG once it is open.  Her family is very supportive and understand that her treatments may have secondary causes that may result in her slipping into respiratory failure.  They understand that at some point that she may require chronic opiates and they are very supportive of her in this decision as they really just want to make her comfortable and recognize that she has quite a long time in order to be there for them.  She expressed to them that it is hard for her to think being very sad about losing her and that is what makes her anxious.  At this time, we will add a very low dose p.r.n. dose of Ativan and continue the Roxanol to see if she is able to stabilize.  I have offered that she may move to the Palliative Care Unit for  symptom management as early as the morning once we see how she does overnight.  The patient is very open to that and she wants to share as much time as she has with her family.  Total time with this patient and family was from 5:05 p.m. to 6:20 p.m. Greater than 50% in this interaction constituted counseling and coordination of care.     Sharina Petre L. Ladona Ridgel, MD     MLT/MEDQ  D:  03/17/2010  T:  03/17/2010  Job:  [id number]  cc:   Charlcie Cradle. Delford Field, MD, [facility] Holley Bouche, M.D. Garnetta Buddy, M.D. Coralyn Helling, MD  Electronically Signed by Derenda Mis MD on 04/06/2010 12:51:38 PM

## 2010-04-16 DEATH — deceased

## 2010-04-27 LAB — CBC
HCT: 37.6 % (ref 36.0–46.0)
Hemoglobin: 11 g/dL — ABNORMAL LOW (ref 12.0–15.0)
Hemoglobin: 11.2 g/dL — ABNORMAL LOW (ref 12.0–15.0)
Hemoglobin: 11.3 g/dL — ABNORMAL LOW (ref 12.0–15.0)
MCH: 28.4 pg (ref 26.0–34.0)
MCH: 29 pg (ref 26.0–34.0)
MCHC: 28.8 g/dL — ABNORMAL LOW (ref 30.0–36.0)
MCHC: 29.3 g/dL — ABNORMAL LOW (ref 30.0–36.0)
MCV: 97.2 fL (ref 78.0–100.0)
RBC: 4.02 MIL/uL (ref 3.87–5.11)
WBC: 6.7 10*3/uL (ref 4.0–10.5)

## 2010-04-27 LAB — GLUCOSE, CAPILLARY
Glucose-Capillary: 164 mg/dL — ABNORMAL HIGH (ref 70–99)
Glucose-Capillary: 178 mg/dL — ABNORMAL HIGH (ref 70–99)
Glucose-Capillary: 179 mg/dL — ABNORMAL HIGH (ref 70–99)
Glucose-Capillary: 184 mg/dL — ABNORMAL HIGH (ref 70–99)
Glucose-Capillary: 186 mg/dL — ABNORMAL HIGH (ref 70–99)
Glucose-Capillary: 186 mg/dL — ABNORMAL HIGH (ref 70–99)
Glucose-Capillary: 187 mg/dL — ABNORMAL HIGH (ref 70–99)
Glucose-Capillary: 193 mg/dL — ABNORMAL HIGH (ref 70–99)
Glucose-Capillary: 194 mg/dL — ABNORMAL HIGH (ref 70–99)
Glucose-Capillary: 206 mg/dL — ABNORMAL HIGH (ref 70–99)
Glucose-Capillary: 213 mg/dL — ABNORMAL HIGH (ref 70–99)
Glucose-Capillary: 214 mg/dL — ABNORMAL HIGH (ref 70–99)
Glucose-Capillary: 233 mg/dL — ABNORMAL HIGH (ref 70–99)
Glucose-Capillary: 237 mg/dL — ABNORMAL HIGH (ref 70–99)
Glucose-Capillary: 242 mg/dL — ABNORMAL HIGH (ref 70–99)
Glucose-Capillary: 243 mg/dL — ABNORMAL HIGH (ref 70–99)
Glucose-Capillary: 250 mg/dL — ABNORMAL HIGH (ref 70–99)
Glucose-Capillary: 257 mg/dL — ABNORMAL HIGH (ref 70–99)
Glucose-Capillary: 278 mg/dL — ABNORMAL HIGH (ref 70–99)

## 2010-04-27 LAB — RETICULOCYTES
Retic Count, Absolute: 109.2 10*3/uL (ref 19.0–186.0)
Retic Ct Pct: 2.8 % (ref 0.4–3.1)

## 2010-04-27 LAB — RENAL FUNCTION PANEL
CO2: 26 mEq/L (ref 19–32)
CO2: 27 mEq/L (ref 19–32)
Calcium: 10.6 mg/dL — ABNORMAL HIGH (ref 8.4–10.5)
Calcium: 11.1 mg/dL — ABNORMAL HIGH (ref 8.4–10.5)
Chloride: 97 mEq/L (ref 96–112)
Creatinine, Ser: 4.71 mg/dL — ABNORMAL HIGH (ref 0.4–1.2)
Glucose, Bld: 206 mg/dL — ABNORMAL HIGH (ref 70–99)
Glucose, Bld: 219 mg/dL — ABNORMAL HIGH (ref 70–99)
Sodium: 133 mEq/L — ABNORMAL LOW (ref 135–145)

## 2010-04-27 LAB — PTH, INTACT AND CALCIUM
Calcium, Total (PTH): 11.5 mg/dL — ABNORMAL HIGH (ref 8.4–10.5)
PTH: <2.5 pg/mL — ABNORMAL LOW (ref 14.0–72.0)

## 2010-04-27 LAB — BASIC METABOLIC PANEL
BUN: 43 mg/dL — ABNORMAL HIGH (ref 6–23)
CO2: 29 mEq/L (ref 19–32)
Calcium: 11.2 mg/dL — ABNORMAL HIGH (ref 8.4–10.5)
Creatinine, Ser: 4.03 mg/dL — ABNORMAL HIGH (ref 0.4–1.2)
Glucose, Bld: 249 mg/dL — ABNORMAL HIGH (ref 70–99)
Sodium: 130 mEq/L — ABNORMAL LOW (ref 135–145)

## 2010-04-27 LAB — PROTIME-INR
INR: 2.26 — ABNORMAL HIGH (ref 0.00–1.49)
Prothrombin Time: 25.1 seconds — ABNORMAL HIGH (ref 11.6–15.2)
Prothrombin Time: 25.8 seconds — ABNORMAL HIGH (ref 11.6–15.2)

## 2010-04-27 LAB — IRON AND TIBC: Iron: 32 ug/dL — ABNORMAL LOW (ref 42–135)

## 2010-04-27 LAB — FERRITIN: Ferritin: 529 ng/mL — ABNORMAL HIGH (ref 10–291)

## 2010-04-28 LAB — PROTIME-INR
INR: 1.27 (ref 0.00–1.49)
INR: 1.32 (ref 0.00–1.49)
INR: 1.4 (ref 0.00–1.49)
INR: 1.41 (ref 0.00–1.49)
INR: 1.5 — ABNORMAL HIGH (ref 0.00–1.49)
INR: 1.57 — ABNORMAL HIGH (ref 0.00–1.49)
INR: 1.78 — ABNORMAL HIGH (ref 0.00–1.49)
INR: 1.8 — ABNORMAL HIGH (ref 0.00–1.49)
INR: 1.94 — ABNORMAL HIGH (ref 0.00–1.49)
INR: 2 — ABNORMAL HIGH (ref 0.00–1.49)
INR: 2.12 — ABNORMAL HIGH (ref 0.00–1.49)
INR: 2.12 — ABNORMAL HIGH (ref 0.00–1.49)
INR: 2.25 — ABNORMAL HIGH (ref 0.00–1.49)
INR: 2.3 — ABNORMAL HIGH (ref 0.00–1.49)
INR: 2.31 — ABNORMAL HIGH (ref 0.00–1.49)
INR: 2.31 — ABNORMAL HIGH (ref 0.00–1.49)
INR: 2.32 — ABNORMAL HIGH (ref 0.00–1.49)
INR: 2.49 — ABNORMAL HIGH (ref 0.00–1.49)
INR: 2.53 — ABNORMAL HIGH (ref 0.00–1.49)
INR: 2.56 — ABNORMAL HIGH (ref 0.00–1.49)
INR: 3.63 — ABNORMAL HIGH (ref 0.00–1.49)
Prothrombin Time: 16.1 s — ABNORMAL HIGH (ref 11.6–15.2)
Prothrombin Time: 16.6 s — ABNORMAL HIGH (ref 11.6–15.2)
Prothrombin Time: 17.4 seconds — ABNORMAL HIGH (ref 11.6–15.2)
Prothrombin Time: 17.5 seconds — ABNORMAL HIGH (ref 11.6–15.2)
Prothrombin Time: 18.3 s — ABNORMAL HIGH (ref 11.6–15.2)
Prothrombin Time: 19 seconds — ABNORMAL HIGH (ref 11.6–15.2)
Prothrombin Time: 20.9 s — ABNORMAL HIGH (ref 11.6–15.2)
Prothrombin Time: 21.9 seconds — ABNORMAL HIGH (ref 11.6–15.2)
Prothrombin Time: 22.3 seconds — ABNORMAL HIGH (ref 11.6–15.2)
Prothrombin Time: 23.9 seconds — ABNORMAL HIGH (ref 11.6–15.2)
Prothrombin Time: 23.9 seconds — ABNORMAL HIGH (ref 11.6–15.2)
Prothrombin Time: 25 seconds — ABNORMAL HIGH (ref 11.6–15.2)
Prothrombin Time: 25.4 seconds — ABNORMAL HIGH (ref 11.6–15.2)
Prothrombin Time: 25.5 s — ABNORMAL HIGH (ref 11.6–15.2)
Prothrombin Time: 25.5 seconds — ABNORMAL HIGH (ref 11.6–15.2)
Prothrombin Time: 27 seconds — ABNORMAL HIGH (ref 11.6–15.2)
Prothrombin Time: 27.4 seconds — ABNORMAL HIGH (ref 11.6–15.2)
Prothrombin Time: 27.6 s — ABNORMAL HIGH (ref 11.6–15.2)
Prothrombin Time: 29 seconds — ABNORMAL HIGH (ref 11.6–15.2)
Prothrombin Time: 36.1 s — ABNORMAL HIGH (ref 11.6–15.2)
Prothrombin Time: 46.8 seconds — ABNORMAL HIGH (ref 11.6–15.2)

## 2010-04-28 LAB — RENAL FUNCTION PANEL
Albumin: 1.4 g/dL — ABNORMAL LOW (ref 3.5–5.2)
Albumin: 2.3 g/dL — ABNORMAL LOW (ref 3.5–5.2)
Albumin: 2.6 g/dL — ABNORMAL LOW (ref 3.5–5.2)
Albumin: 2.7 g/dL — ABNORMAL LOW (ref 3.5–5.2)
Albumin: 2.7 g/dL — ABNORMAL LOW (ref 3.5–5.2)
Albumin: 2.8 g/dL — ABNORMAL LOW (ref 3.5–5.2)
Albumin: 2.8 g/dL — ABNORMAL LOW (ref 3.5–5.2)
Albumin: 2.9 g/dL — ABNORMAL LOW (ref 3.5–5.2)
BUN: 10 mg/dL (ref 6–23)
BUN: 11 mg/dL (ref 6–23)
BUN: 14 mg/dL (ref 6–23)
BUN: 20 mg/dL (ref 6–23)
BUN: 30 mg/dL — ABNORMAL HIGH (ref 6–23)
BUN: 35 mg/dL — ABNORMAL HIGH (ref 6–23)
BUN: 69 mg/dL — ABNORMAL HIGH (ref 6–23)
BUN: 7 mg/dL (ref 6–23)
CO2: 17 mEq/L — ABNORMAL LOW (ref 19–32)
CO2: 27 mEq/L (ref 19–32)
CO2: 27 meq/L (ref 19–32)
CO2: 29 mEq/L (ref 19–32)
CO2: 29 mEq/L (ref 19–32)
CO2: 29 meq/L (ref 19–32)
CO2: 29 meq/L (ref 19–32)
CO2: 30 mEq/L (ref 19–32)
CO2: 32 mEq/L (ref 19–32)
Calcium: 10.4 mg/dL (ref 8.4–10.5)
Calcium: 10.4 mg/dL (ref 8.4–10.5)
Calcium: 8.3 mg/dL — ABNORMAL LOW (ref 8.4–10.5)
Calcium: 8.5 mg/dL (ref 8.4–10.5)
Calcium: 8.7 mg/dL (ref 8.4–10.5)
Calcium: 9.1 mg/dL (ref 8.4–10.5)
Calcium: 9.4 mg/dL (ref 8.4–10.5)
Calcium: 9.5 mg/dL (ref 8.4–10.5)
Calcium: 9.8 mg/dL (ref 8.4–10.5)
Chloride: 100 mEq/L (ref 96–112)
Chloride: 100 meq/L (ref 96–112)
Chloride: 102 mEq/L (ref 96–112)
Chloride: 102 meq/L (ref 96–112)
Chloride: 102 meq/L (ref 96–112)
Chloride: 120 mEq/L — ABNORMAL HIGH (ref 96–112)
Chloride: 96 mEq/L (ref 96–112)
Chloride: 97 mEq/L (ref 96–112)
Chloride: 99 mEq/L (ref 96–112)
Creatinine, Ser: 1.58 mg/dL — ABNORMAL HIGH (ref 0.4–1.2)
Creatinine, Ser: 1.68 mg/dL — ABNORMAL HIGH (ref 0.4–1.2)
Creatinine, Ser: 2.47 mg/dL — ABNORMAL HIGH (ref 0.4–1.2)
Creatinine, Ser: 2.48 mg/dL — ABNORMAL HIGH (ref 0.4–1.2)
Creatinine, Ser: 2.68 mg/dL — ABNORMAL HIGH (ref 0.4–1.2)
Creatinine, Ser: 2.85 mg/dL — ABNORMAL HIGH (ref 0.4–1.2)
Creatinine, Ser: 3.3 mg/dL — ABNORMAL HIGH (ref 0.4–1.2)
Creatinine, Ser: 3.51 mg/dL — ABNORMAL HIGH (ref 0.4–1.2)
Creatinine, Ser: 3.8 mg/dL — ABNORMAL HIGH (ref 0.4–1.2)
GFR calc Af Amer: 15 mL/min — ABNORMAL LOW (ref >60)
GFR calc Af Amer: 17 mL/min — ABNORMAL LOW (ref >60)
GFR calc Af Amer: 18 mL/min — ABNORMAL LOW (ref >60)
GFR calc Af Amer: 23 mL/min — ABNORMAL LOW (ref >60)
GFR calc non Af Amer: 13 mL/min — ABNORMAL LOW (ref >60)
GFR calc non Af Amer: 14 mL/min — ABNORMAL LOW (ref >60)
GFR calc non Af Amer: 15 mL/min — ABNORMAL LOW (ref >60)
GFR calc non Af Amer: 16 mL/min — ABNORMAL LOW
GFR calc non Af Amer: 19 mL/min — ABNORMAL LOW (ref >60)
GFR calc non Af Amer: 30 mL/min — ABNORMAL LOW
GFR calc non Af Amer: 32 mL/min — ABNORMAL LOW
Glucose, Bld: 100 mg/dL — ABNORMAL HIGH (ref 70–99)
Glucose, Bld: 111 mg/dL — ABNORMAL HIGH (ref 70–99)
Glucose, Bld: 118 mg/dL — ABNORMAL HIGH (ref 70–99)
Glucose, Bld: 140 mg/dL — ABNORMAL HIGH (ref 70–99)
Glucose, Bld: 155 mg/dL — ABNORMAL HIGH (ref 70–99)
Glucose, Bld: 166 mg/dL — ABNORMAL HIGH (ref 70–99)
Glucose, Bld: 207 mg/dL — ABNORMAL HIGH (ref 70–99)
Glucose, Bld: 212 mg/dL — ABNORMAL HIGH (ref 70–99)
Glucose, Bld: 214 mg/dL — ABNORMAL HIGH (ref 70–99)
Phosphorus: 3.4 mg/dL (ref 2.3–4.6)
Phosphorus: 5.1 mg/dL — ABNORMAL HIGH (ref 2.3–4.6)
Phosphorus: 5.4 mg/dL — ABNORMAL HIGH (ref 2.3–4.6)
Phosphorus: 5.6 mg/dL — ABNORMAL HIGH (ref 2.3–4.6)
Phosphorus: 5.7 mg/dL — ABNORMAL HIGH (ref 2.3–4.6)
Phosphorus: 6.8 mg/dL — ABNORMAL HIGH (ref 2.3–4.6)
Phosphorus: <1 mg/dL — CL (ref 2.3–4.6)
Potassium: 2.2 mEq/L — CL (ref 3.5–5.1)
Potassium: 3.4 meq/L — ABNORMAL LOW (ref 3.5–5.1)
Potassium: 3.5 mEq/L (ref 3.5–5.1)
Potassium: 3.5 meq/L (ref 3.5–5.1)
Potassium: 4.1 mEq/L (ref 3.5–5.1)
Potassium: 4.1 mEq/L (ref 3.5–5.1)
Potassium: 4.2 meq/L (ref 3.5–5.1)
Potassium: 4.4 mEq/L (ref 3.5–5.1)
Potassium: 5.3 mEq/L — ABNORMAL HIGH (ref 3.5–5.1)
Sodium: 133 mEq/L — ABNORMAL LOW (ref 135–145)
Sodium: 134 mEq/L — ABNORMAL LOW (ref 135–145)
Sodium: 138 mEq/L (ref 135–145)
Sodium: 138 meq/L (ref 135–145)
Sodium: 139 meq/L (ref 135–145)
Sodium: 140 meq/L (ref 135–145)

## 2010-04-28 LAB — CULTURE, RESPIRATORY W GRAM STAIN

## 2010-04-28 LAB — BASIC METABOLIC PANEL
BUN: 17 mg/dL (ref 6–23)
BUN: 25 mg/dL — ABNORMAL HIGH (ref 6–23)
BUN: 3 mg/dL — ABNORMAL LOW (ref 6–23)
BUN: 6 mg/dL (ref 6–23)
BUN: 9 mg/dL (ref 6–23)
CO2: 28 mEq/L (ref 19–32)
CO2: 28 mEq/L (ref 19–32)
CO2: 29 mEq/L (ref 19–32)
CO2: 30 mEq/L (ref 19–32)
CO2: 32 mEq/L (ref 19–32)
Calcium: 10 mg/dL (ref 8.4–10.5)
Calcium: 9 mg/dL (ref 8.4–10.5)
Calcium: 9.2 mg/dL (ref 8.4–10.5)
Calcium: 9.3 mg/dL (ref 8.4–10.5)
Calcium: 9.4 mg/dL (ref 8.4–10.5)
Calcium: 9.9 mg/dL (ref 8.4–10.5)
Chloride: 100 mEq/L (ref 96–112)
Chloride: 102 mEq/L (ref 96–112)
Chloride: 99 mEq/L (ref 96–112)
Chloride: 99 mEq/L (ref 96–112)
Creatinine, Ser: 1.5 mg/dL — ABNORMAL HIGH (ref 0.4–1.2)
Creatinine, Ser: 2.31 mg/dL — ABNORMAL HIGH (ref 0.4–1.2)
Creatinine, Ser: 2.41 mg/dL — ABNORMAL HIGH (ref 0.4–1.2)
Creatinine, Ser: 2.65 mg/dL — ABNORMAL HIGH (ref 0.4–1.2)
Creatinine, Ser: 2.77 mg/dL — ABNORMAL HIGH (ref 0.4–1.2)
GFR calc Af Amer: 19 mL/min — ABNORMAL LOW (ref >60)
GFR calc Af Amer: 21 mL/min — ABNORMAL LOW (ref >60)
GFR calc Af Amer: 24 mL/min — ABNORMAL LOW (ref >60)
GFR calc Af Amer: 41 mL/min — ABNORMAL LOW (ref >60)
GFR calc non Af Amer: 15 mL/min — ABNORMAL LOW (ref >60)
GFR calc non Af Amer: 15 mL/min — ABNORMAL LOW (ref >60)
GFR calc non Af Amer: 16 mL/min — ABNORMAL LOW (ref >60)
GFR calc non Af Amer: 20 mL/min — ABNORMAL LOW (ref >60)
GFR calc non Af Amer: 21 mL/min — ABNORMAL LOW (ref >60)
GFR calc non Af Amer: 23 mL/min — ABNORMAL LOW (ref >60)
GFR calc non Af Amer: 34 mL/min — ABNORMAL LOW (ref >60)
Glucose, Bld: 160 mg/dL — ABNORMAL HIGH (ref 70–99)
Glucose, Bld: 160 mg/dL — ABNORMAL HIGH (ref 70–99)
Glucose, Bld: 162 mg/dL — ABNORMAL HIGH (ref 70–99)
Glucose, Bld: 165 mg/dL — ABNORMAL HIGH (ref 70–99)
Glucose, Bld: 168 mg/dL — ABNORMAL HIGH (ref 70–99)
Glucose, Bld: 182 mg/dL — ABNORMAL HIGH (ref 70–99)
Glucose, Bld: 205 mg/dL — ABNORMAL HIGH (ref 70–99)
Potassium: 2.8 mEq/L — ABNORMAL LOW (ref 3.5–5.1)
Potassium: 3.4 mEq/L — ABNORMAL LOW (ref 3.5–5.1)
Potassium: 3.6 mEq/L (ref 3.5–5.1)
Potassium: 4.7 mEq/L (ref 3.5–5.1)
Sodium: 132 mEq/L — ABNORMAL LOW (ref 135–145)
Sodium: 135 mEq/L (ref 135–145)
Sodium: 136 mEq/L (ref 135–145)
Sodium: 137 mEq/L (ref 135–145)
Sodium: 137 mEq/L (ref 135–145)
Sodium: 138 mEq/L (ref 135–145)

## 2010-04-28 LAB — BASIC METABOLIC PANEL WITH GFR
BUN: 17 mg/dL (ref 6–23)
CO2: 26 meq/L (ref 19–32)
Calcium: 8.8 mg/dL (ref 8.4–10.5)
Chloride: 99 meq/L (ref 96–112)
Creatinine, Ser: 2.39 mg/dL — ABNORMAL HIGH (ref 0.4–1.2)
GFR calc non Af Amer: 20 mL/min — ABNORMAL LOW
Glucose, Bld: 80 mg/dL (ref 70–99)
Potassium: 3.1 meq/L — ABNORMAL LOW (ref 3.5–5.1)
Sodium: 134 meq/L — ABNORMAL LOW (ref 135–145)

## 2010-04-28 LAB — GLUCOSE, CAPILLARY
Glucose-Capillary: 104 mg/dL — ABNORMAL HIGH (ref 70–99)
Glucose-Capillary: 105 mg/dL — ABNORMAL HIGH (ref 70–99)
Glucose-Capillary: 108 mg/dL — ABNORMAL HIGH (ref 70–99)
Glucose-Capillary: 112 mg/dL — ABNORMAL HIGH (ref 70–99)
Glucose-Capillary: 113 mg/dL — ABNORMAL HIGH (ref 70–99)
Glucose-Capillary: 117 mg/dL — ABNORMAL HIGH (ref 70–99)
Glucose-Capillary: 117 mg/dL — ABNORMAL HIGH (ref 70–99)
Glucose-Capillary: 118 mg/dL — ABNORMAL HIGH (ref 70–99)
Glucose-Capillary: 122 mg/dL — ABNORMAL HIGH (ref 70–99)
Glucose-Capillary: 122 mg/dL — ABNORMAL HIGH (ref 70–99)
Glucose-Capillary: 124 mg/dL — ABNORMAL HIGH (ref 70–99)
Glucose-Capillary: 125 mg/dL — ABNORMAL HIGH (ref 70–99)
Glucose-Capillary: 127 mg/dL — ABNORMAL HIGH (ref 70–99)
Glucose-Capillary: 131 mg/dL — ABNORMAL HIGH (ref 70–99)
Glucose-Capillary: 143 mg/dL — ABNORMAL HIGH (ref 70–99)
Glucose-Capillary: 146 mg/dL — ABNORMAL HIGH (ref 70–99)
Glucose-Capillary: 149 mg/dL — ABNORMAL HIGH (ref 70–99)
Glucose-Capillary: 149 mg/dL — ABNORMAL HIGH (ref 70–99)
Glucose-Capillary: 152 mg/dL — ABNORMAL HIGH (ref 70–99)
Glucose-Capillary: 152 mg/dL — ABNORMAL HIGH (ref 70–99)
Glucose-Capillary: 154 mg/dL — ABNORMAL HIGH (ref 70–99)
Glucose-Capillary: 156 mg/dL — ABNORMAL HIGH (ref 70–99)
Glucose-Capillary: 156 mg/dL — ABNORMAL HIGH (ref 70–99)
Glucose-Capillary: 158 mg/dL — ABNORMAL HIGH (ref 70–99)
Glucose-Capillary: 161 mg/dL — ABNORMAL HIGH (ref 70–99)
Glucose-Capillary: 161 mg/dL — ABNORMAL HIGH (ref 70–99)
Glucose-Capillary: 163 mg/dL — ABNORMAL HIGH (ref 70–99)
Glucose-Capillary: 163 mg/dL — ABNORMAL HIGH (ref 70–99)
Glucose-Capillary: 165 mg/dL — ABNORMAL HIGH (ref 70–99)
Glucose-Capillary: 166 mg/dL — ABNORMAL HIGH (ref 70–99)
Glucose-Capillary: 167 mg/dL — ABNORMAL HIGH (ref 70–99)
Glucose-Capillary: 167 mg/dL — ABNORMAL HIGH (ref 70–99)
Glucose-Capillary: 170 mg/dL — ABNORMAL HIGH (ref 70–99)
Glucose-Capillary: 171 mg/dL — ABNORMAL HIGH (ref 70–99)
Glucose-Capillary: 174 mg/dL — ABNORMAL HIGH (ref 70–99)
Glucose-Capillary: 178 mg/dL — ABNORMAL HIGH (ref 70–99)
Glucose-Capillary: 181 mg/dL — ABNORMAL HIGH (ref 70–99)
Glucose-Capillary: 182 mg/dL — ABNORMAL HIGH (ref 70–99)
Glucose-Capillary: 182 mg/dL — ABNORMAL HIGH (ref 70–99)
Glucose-Capillary: 182 mg/dL — ABNORMAL HIGH (ref 70–99)
Glucose-Capillary: 183 mg/dL — ABNORMAL HIGH (ref 70–99)
Glucose-Capillary: 183 mg/dL — ABNORMAL HIGH (ref 70–99)
Glucose-Capillary: 186 mg/dL — ABNORMAL HIGH (ref 70–99)
Glucose-Capillary: 186 mg/dL — ABNORMAL HIGH (ref 70–99)
Glucose-Capillary: 188 mg/dL — ABNORMAL HIGH (ref 70–99)
Glucose-Capillary: 189 mg/dL — ABNORMAL HIGH (ref 70–99)
Glucose-Capillary: 198 mg/dL — ABNORMAL HIGH (ref 70–99)
Glucose-Capillary: 203 mg/dL — ABNORMAL HIGH (ref 70–99)
Glucose-Capillary: 204 mg/dL — ABNORMAL HIGH (ref 70–99)
Glucose-Capillary: 205 mg/dL — ABNORMAL HIGH (ref 70–99)
Glucose-Capillary: 205 mg/dL — ABNORMAL HIGH (ref 70–99)
Glucose-Capillary: 211 mg/dL — ABNORMAL HIGH (ref 70–99)
Glucose-Capillary: 211 mg/dL — ABNORMAL HIGH (ref 70–99)
Glucose-Capillary: 212 mg/dL — ABNORMAL HIGH (ref 70–99)
Glucose-Capillary: 214 mg/dL — ABNORMAL HIGH (ref 70–99)
Glucose-Capillary: 217 mg/dL — ABNORMAL HIGH (ref 70–99)
Glucose-Capillary: 220 mg/dL — ABNORMAL HIGH (ref 70–99)
Glucose-Capillary: 221 mg/dL — ABNORMAL HIGH (ref 70–99)
Glucose-Capillary: 224 mg/dL — ABNORMAL HIGH (ref 70–99)
Glucose-Capillary: 224 mg/dL — ABNORMAL HIGH (ref 70–99)
Glucose-Capillary: 225 mg/dL — ABNORMAL HIGH (ref 70–99)
Glucose-Capillary: 226 mg/dL — ABNORMAL HIGH (ref 70–99)
Glucose-Capillary: 226 mg/dL — ABNORMAL HIGH (ref 70–99)
Glucose-Capillary: 226 mg/dL — ABNORMAL HIGH (ref 70–99)
Glucose-Capillary: 228 mg/dL — ABNORMAL HIGH (ref 70–99)
Glucose-Capillary: 231 mg/dL — ABNORMAL HIGH (ref 70–99)
Glucose-Capillary: 237 mg/dL — ABNORMAL HIGH (ref 70–99)
Glucose-Capillary: 241 mg/dL — ABNORMAL HIGH (ref 70–99)
Glucose-Capillary: 275 mg/dL — ABNORMAL HIGH (ref 70–99)
Glucose-Capillary: 280 mg/dL — ABNORMAL HIGH (ref 70–99)
Glucose-Capillary: 282 mg/dL — ABNORMAL HIGH (ref 70–99)
Glucose-Capillary: 282 mg/dL — ABNORMAL HIGH (ref 70–99)
Glucose-Capillary: 293 mg/dL — ABNORMAL HIGH (ref 70–99)
Glucose-Capillary: 313 mg/dL — ABNORMAL HIGH (ref 70–99)
Glucose-Capillary: 333 mg/dL — ABNORMAL HIGH (ref 70–99)
Glucose-Capillary: 77 mg/dL (ref 70–99)
Glucose-Capillary: 86 mg/dL (ref 70–99)
Glucose-Capillary: 86 mg/dL (ref 70–99)
Glucose-Capillary: 87 mg/dL (ref 70–99)
Glucose-Capillary: 89 mg/dL (ref 70–99)
Glucose-Capillary: 90 mg/dL (ref 70–99)
Glucose-Capillary: 94 mg/dL (ref 70–99)
Glucose-Capillary: 94 mg/dL (ref 70–99)
Glucose-Capillary: 96 mg/dL (ref 70–99)

## 2010-04-28 LAB — BLOOD GAS, ARTERIAL
Acid-Base Excess: 1.2 mmol/L (ref 0.0–2.0)
Bicarbonate: 28.3 mEq/L — ABNORMAL HIGH (ref 20.0–24.0)
Bicarbonate: 29.5 mEq/L — ABNORMAL HIGH (ref 20.0–24.0)
Bicarbonate: 30.3 meq/L — ABNORMAL HIGH (ref 20.0–24.0)
Drawn by: 12971
Drawn by: 31101
Drawn by: 32463
Drawn by: 33247
FIO2: 0.32 %
FIO2: 1 %
O2 Saturation: 93.3 %
O2 Saturation: 99.4 %
Patient temperature: 97.8
Patient temperature: 98.6
Patient temperature: 98.6
Patient temperature: 98.6
TCO2: 30 mmol/L (ref 0–100)
TCO2: 31.7 mmol/L (ref 0–100)
TCO2: 33.5 mmol/L (ref 0–100)
pCO2 arterial: 104 mmHg (ref 35.0–45.0)
pCO2 arterial: 56.2 mmHg — ABNORMAL HIGH (ref 35.0–45.0)
pCO2 arterial: 72.9 mmHg (ref 35.0–45.0)
pH, Arterial: 7.093 — CL (ref 7.350–7.400)
pH, Arterial: 7.202 — ABNORMAL LOW (ref 7.350–7.400)
pH, Arterial: 7.231 — ABNORMAL LOW (ref 7.350–7.400)
pH, Arterial: 7.322 — ABNORMAL LOW (ref 7.350–7.400)
pO2, Arterial: 136 mmHg — ABNORMAL HIGH (ref 80.0–100.0)
pO2, Arterial: 69.2 mmHg — ABNORMAL LOW (ref 80.0–100.0)

## 2010-04-28 LAB — URINE CULTURE: Culture  Setup Time: 201111071755

## 2010-04-28 LAB — LACTIC ACID, PLASMA: Lactic Acid, Venous: 1.1 mmol/L (ref 0.5–2.2)

## 2010-04-28 LAB — CBC
HCT: 32.2 % — ABNORMAL LOW (ref 36.0–46.0)
HCT: 32.7 % — ABNORMAL LOW (ref 36.0–46.0)
HCT: 34.2 % — ABNORMAL LOW (ref 36.0–46.0)
HCT: 34.6 % — ABNORMAL LOW (ref 36.0–46.0)
HCT: 35.7 % — ABNORMAL LOW (ref 36.0–46.0)
HCT: 37.8 % (ref 36.0–46.0)
HCT: 39.1 % (ref 36.0–46.0)
HCT: 39.3 % (ref 36.0–46.0)
HCT: 39.9 % (ref 36.0–46.0)
HCT: 40.7 % (ref 36.0–46.0)
HCT: 40.7 % (ref 36.0–46.0)
HCT: 40.9 % (ref 36.0–46.0)
Hemoglobin: 10.1 g/dL — ABNORMAL LOW (ref 12.0–15.0)
Hemoglobin: 10.5 g/dL — ABNORMAL LOW (ref 12.0–15.0)
Hemoglobin: 10.6 g/dL — ABNORMAL LOW (ref 12.0–15.0)
Hemoglobin: 10.8 g/dL — ABNORMAL LOW (ref 12.0–15.0)
Hemoglobin: 10.8 g/dL — ABNORMAL LOW (ref 12.0–15.0)
Hemoglobin: 10.9 g/dL — ABNORMAL LOW (ref 12.0–15.0)
Hemoglobin: 11.2 g/dL — ABNORMAL LOW (ref 12.0–15.0)
Hemoglobin: 11.2 g/dL — ABNORMAL LOW (ref 12.0–15.0)
Hemoglobin: 11.3 g/dL — ABNORMAL LOW (ref 12.0–15.0)
Hemoglobin: 11.4 g/dL — ABNORMAL LOW (ref 12.0–15.0)
Hemoglobin: 11.5 g/dL — ABNORMAL LOW (ref 12.0–15.0)
Hemoglobin: 11.9 g/dL — ABNORMAL LOW (ref 12.0–15.0)
Hemoglobin: 9.5 g/dL — ABNORMAL LOW (ref 12.0–15.0)
Hemoglobin: 9.8 g/dL — ABNORMAL LOW (ref 12.0–15.0)
Hemoglobin: 9.8 g/dL — ABNORMAL LOW (ref 12.0–15.0)
MCH: 28.1 pg (ref 26.0–34.0)
MCH: 28.2 pg (ref 26.0–34.0)
MCH: 28.6 pg (ref 26.0–34.0)
MCH: 28.6 pg (ref 26.0–34.0)
MCH: 28.7 pg (ref 26.0–34.0)
MCH: 28.8 pg (ref 26.0–34.0)
MCH: 28.8 pg (ref 26.0–34.0)
MCH: 28.9 pg (ref 26.0–34.0)
MCH: 29 pg (ref 26.0–34.0)
MCH: 29.1 pg (ref 26.0–34.0)
MCH: 29.2 pg (ref 26.0–34.0)
MCH: 29.3 pg (ref 26.0–34.0)
MCH: 29.3 pg (ref 26.0–34.0)
MCHC: 27.5 g/dL — ABNORMAL LOW (ref 30.0–36.0)
MCHC: 27.6 g/dL — ABNORMAL LOW (ref 30.0–36.0)
MCHC: 27.8 g/dL — ABNORMAL LOW (ref 30.0–36.0)
MCHC: 27.9 g/dL — ABNORMAL LOW (ref 30.0–36.0)
MCHC: 28 g/dL — ABNORMAL LOW (ref 30.0–36.0)
MCHC: 28 g/dL — ABNORMAL LOW (ref 30.0–36.0)
MCHC: 28.3 g/dL — ABNORMAL LOW (ref 30.0–36.0)
MCHC: 28.3 g/dL — ABNORMAL LOW (ref 30.0–36.0)
MCHC: 28.3 g/dL — ABNORMAL LOW (ref 30.0–36.0)
MCHC: 28.6 g/dL — ABNORMAL LOW (ref 30.0–36.0)
MCHC: 28.6 g/dL — ABNORMAL LOW (ref 30.0–36.0)
MCHC: 28.7 g/dL — ABNORMAL LOW (ref 30.0–36.0)
MCHC: 29.2 g/dL — ABNORMAL LOW (ref 30.0–36.0)
MCHC: 29.5 g/dL — ABNORMAL LOW (ref 30.0–36.0)
MCHC: 30 g/dL (ref 30.0–36.0)
MCV: 101 fL — ABNORMAL HIGH (ref 78.0–100.0)
MCV: 101.1 fL — ABNORMAL HIGH (ref 78.0–100.0)
MCV: 101.2 fL — ABNORMAL HIGH (ref 78.0–100.0)
MCV: 101.7 fL — ABNORMAL HIGH (ref 78.0–100.0)
MCV: 102.2 fL — ABNORMAL HIGH (ref 78.0–100.0)
MCV: 102.3 fL — ABNORMAL HIGH (ref 78.0–100.0)
MCV: 102.8 fL — ABNORMAL HIGH (ref 78.0–100.0)
MCV: 104.1 fL — ABNORMAL HIGH (ref 78.0–100.0)
MCV: 95.9 fL (ref 78.0–100.0)
MCV: 98.6 fL (ref 78.0–100.0)
MCV: 98.8 fL (ref 78.0–100.0)
MCV: 99.2 fL (ref 78.0–100.0)
MCV: 99.5 fL (ref 78.0–100.0)
MCV: 99.5 fL (ref 78.0–100.0)
Platelets: 145 10*3/uL — ABNORMAL LOW (ref 150–400)
Platelets: 146 10*3/uL — ABNORMAL LOW (ref 150–400)
Platelets: 161 10*3/uL (ref 150–400)
Platelets: 165 10*3/uL (ref 150–400)
Platelets: 175 10*3/uL (ref 150–400)
Platelets: 175 10*3/uL (ref 150–400)
Platelets: 204 K/uL (ref 150–400)
Platelets: 212 10*3/uL (ref 150–400)
Platelets: 216 K/uL (ref 150–400)
Platelets: 225 10*3/uL (ref 150–400)
Platelets: 230 10*3/uL (ref 150–400)
Platelets: 238 10*3/uL (ref 150–400)
Platelets: 263 K/uL (ref 150–400)
Platelets: 283 10*3/uL (ref 150–400)
RBC: 3.41 MIL/uL — ABNORMAL LOW (ref 3.87–5.11)
RBC: 3.47 MIL/uL — ABNORMAL LOW (ref 3.87–5.11)
RBC: 3.47 MIL/uL — ABNORMAL LOW (ref 3.87–5.11)
RBC: 3.58 MIL/uL — ABNORMAL LOW (ref 3.87–5.11)
RBC: 3.6 MIL/uL — ABNORMAL LOW (ref 3.87–5.11)
RBC: 3.62 MIL/uL — ABNORMAL LOW (ref 3.87–5.11)
RBC: 3.72 MIL/uL — ABNORMAL LOW (ref 3.87–5.11)
RBC: 3.81 MIL/uL — ABNORMAL LOW (ref 3.87–5.11)
RBC: 3.83 MIL/uL — ABNORMAL LOW (ref 3.87–5.11)
RBC: 3.84 MIL/uL — ABNORMAL LOW (ref 3.87–5.11)
RBC: 3.87 MIL/uL (ref 3.87–5.11)
RBC: 3.92 MIL/uL (ref 3.87–5.11)
RBC: 3.93 MIL/uL (ref 3.87–5.11)
RBC: 4.03 MIL/uL (ref 3.87–5.11)
RDW: 16.1 % — ABNORMAL HIGH (ref 11.5–15.5)
RDW: 16.2 % — ABNORMAL HIGH (ref 11.5–15.5)
RDW: 16.2 % — ABNORMAL HIGH (ref 11.5–15.5)
RDW: 16.8 % — ABNORMAL HIGH (ref 11.5–15.5)
RDW: 17 % — ABNORMAL HIGH (ref 11.5–15.5)
RDW: 17 % — ABNORMAL HIGH (ref 11.5–15.5)
RDW: 17.1 % — ABNORMAL HIGH (ref 11.5–15.5)
RDW: 17.2 % — ABNORMAL HIGH (ref 11.5–15.5)
RDW: 17.2 % — ABNORMAL HIGH (ref 11.5–15.5)
RDW: 17.4 % — ABNORMAL HIGH (ref 11.5–15.5)
RDW: 17.4 % — ABNORMAL HIGH (ref 11.5–15.5)
RDW: 17.5 % — ABNORMAL HIGH (ref 11.5–15.5)
RDW: 17.6 % — ABNORMAL HIGH (ref 11.5–15.5)
WBC: 10.1 K/uL (ref 4.0–10.5)
WBC: 11.4 10*3/uL — ABNORMAL HIGH (ref 4.0–10.5)
WBC: 5.4 10*3/uL (ref 4.0–10.5)
WBC: 6.3 10*3/uL (ref 4.0–10.5)
WBC: 6.8 10*3/uL (ref 4.0–10.5)
WBC: 6.9 10*3/uL (ref 4.0–10.5)
WBC: 7.2 10*3/uL (ref 4.0–10.5)
WBC: 7.3 10*3/uL (ref 4.0–10.5)
WBC: 7.6 10*3/uL (ref 4.0–10.5)
WBC: 7.9 10*3/uL (ref 4.0–10.5)
WBC: 8.4 10*3/uL (ref 4.0–10.5)
WBC: 9.3 K/uL (ref 4.0–10.5)
WBC: 9.9 K/uL (ref 4.0–10.5)

## 2010-04-28 LAB — CULTURE, BLOOD (ROUTINE X 2)
Culture  Setup Time: 201111072323
Culture  Setup Time: 201111072323
Culture: NO GROWTH
Culture: NO GROWTH

## 2010-04-28 LAB — URINE MICROSCOPIC-ADD ON

## 2010-04-28 LAB — POCT CARDIAC MARKERS: Myoglobin, poc: 130 ng/mL (ref 12–200)

## 2010-04-28 LAB — COMPREHENSIVE METABOLIC PANEL
ALT: 11 U/L (ref 0–35)
AST: 17 U/L (ref 0–37)
AST: 24 U/L (ref 0–37)
Albumin: 2.6 g/dL — ABNORMAL LOW (ref 3.5–5.2)
Albumin: 2.8 g/dL — ABNORMAL LOW (ref 3.5–5.2)
Alkaline Phosphatase: 105 U/L (ref 39–117)
Alkaline Phosphatase: 106 U/L (ref 39–117)
Alkaline Phosphatase: 78 U/L (ref 39–117)
BUN: 10 mg/dL (ref 6–23)
BUN: 16 mg/dL (ref 6–23)
BUN: 8 mg/dL (ref 6–23)
CO2: 26 mEq/L (ref 19–32)
CO2: 30 mEq/L (ref 19–32)
Calcium: 10 mg/dL (ref 8.4–10.5)
Chloride: 100 mEq/L (ref 96–112)
Chloride: 98 mEq/L (ref 96–112)
Creatinine, Ser: 1.74 mg/dL — ABNORMAL HIGH (ref 0.4–1.2)
Creatinine, Ser: 3.36 mg/dL — ABNORMAL HIGH (ref 0.4–1.2)
GFR calc non Af Amer: 13 mL/min — ABNORMAL LOW (ref >60)
GFR calc non Af Amer: 14 mL/min — ABNORMAL LOW (ref >60)
GFR calc non Af Amer: 29 mL/min — ABNORMAL LOW (ref >60)
Potassium: 3 mEq/L — ABNORMAL LOW (ref 3.5–5.1)
Potassium: 4.1 mEq/L (ref 3.5–5.1)
Potassium: 4.1 mEq/L (ref 3.5–5.1)
Sodium: 135 mEq/L (ref 135–145)
Total Bilirubin: 0.2 mg/dL — ABNORMAL LOW (ref 0.3–1.2)
Total Bilirubin: 0.7 mg/dL (ref 0.3–1.2)
Total Protein: 5.1 g/dL — ABNORMAL LOW (ref 6.0–8.3)

## 2010-04-28 LAB — POCT I-STAT 3, ART BLOOD GAS (G3+)
Acid-Base Excess: 3 mmol/L — ABNORMAL HIGH (ref 0.0–2.0)
Acid-Base Excess: 4 mmol/L — ABNORMAL HIGH (ref 0.0–2.0)
Acid-Base Excess: 4 mmol/L — ABNORMAL HIGH (ref 0.0–2.0)
Bicarbonate: 26.1 meq/L — ABNORMAL HIGH (ref 20.0–24.0)
Bicarbonate: 27.1 meq/L — ABNORMAL HIGH (ref 20.0–24.0)
Bicarbonate: 28.1 mEq/L — ABNORMAL HIGH (ref 20.0–24.0)
O2 Saturation: 86 %
O2 Saturation: 98 %
O2 Saturation: 98 %
Patient temperature: 98.6
Patient temperature: 98.7
TCO2: 27 mmol/L (ref 0–100)
TCO2: 28 mmol/L (ref 0–100)
TCO2: 29 mmol/L (ref 0–100)
TCO2: 30 mmol/L (ref 0–100)
pCO2 arterial: 30 mmHg — ABNORMAL LOW (ref 35.0–45.0)
pCO2 arterial: 36.2 mmHg (ref 35.0–45.0)
pCO2 arterial: 47.8 mmHg — ABNORMAL HIGH (ref 35.0–45.0)
pH, Arterial: 7.365 (ref 7.350–7.400)
pH, Arterial: 7.482 — ABNORMAL HIGH (ref 7.350–7.400)
pH, Arterial: 7.547 — ABNORMAL HIGH (ref 7.350–7.400)
pO2, Arterial: 201 mmHg — ABNORMAL HIGH (ref 80.0–100.0)
pO2, Arterial: 44 mmHg — ABNORMAL LOW (ref 80.0–100.0)
pO2, Arterial: 89 mmHg (ref 80.0–100.0)

## 2010-04-28 LAB — PREPARE FRESH FROZEN PLASMA
Unit division: 0
Unit division: 0

## 2010-04-28 LAB — DIFFERENTIAL
Basophils Absolute: 0 10*3/uL (ref 0.0–0.1)
Basophils Absolute: 0 10*3/uL (ref 0.0–0.1)
Eosinophils Absolute: 0 10*3/uL (ref 0.0–0.7)
Lymphocytes Relative: 15 % (ref 12–46)
Lymphocytes Relative: 15 % (ref 12–46)
Lymphs Abs: 1.1 10*3/uL (ref 0.7–4.0)
Monocytes Absolute: 0.7 10*3/uL (ref 0.1–1.0)
Monocytes Relative: 10 % (ref 3–12)
Monocytes Relative: 8 % (ref 3–12)
Neutro Abs: 5.5 10*3/uL (ref 1.7–7.7)
Neutro Abs: 5.7 10*3/uL (ref 1.7–7.7)
Neutrophils Relative %: 75 % (ref 43–77)

## 2010-04-28 LAB — POCT I-STAT 4, (NA,K, GLUC, HGB,HCT)
Glucose, Bld: 99 mg/dL (ref 70–99)
HCT: 30 % — ABNORMAL LOW (ref 36.0–46.0)
Hemoglobin: 10.2 g/dL — ABNORMAL LOW (ref 12.0–15.0)
Potassium: 3.1 meq/L — ABNORMAL LOW (ref 3.5–5.1)
Sodium: 137 meq/L (ref 135–145)

## 2010-04-28 LAB — URINALYSIS, ROUTINE W REFLEX MICROSCOPIC
Glucose, UA: NEGATIVE mg/dL
Ketones, ur: 15 mg/dL — AB
Nitrite: POSITIVE — AB
Protein, ur: 30 mg/dL — AB
Specific Gravity, Urine: 1.021 (ref 1.005–1.030)
Urobilinogen, UA: 0.2 mg/dL (ref 0.0–1.0)
pH: 5 (ref 5.0–8.0)

## 2010-04-28 LAB — HEPARIN LEVEL (UNFRACTIONATED)
Heparin Unfractionated: 0.51 IU/mL (ref 0.30–0.70)
Heparin Unfractionated: <0.1 IU/mL — ABNORMAL LOW (ref 0.30–0.70)

## 2010-04-28 LAB — MRSA PCR SCREENING
MRSA by PCR: NEGATIVE
MRSA by PCR: NEGATIVE

## 2010-04-28 LAB — CARDIAC PANEL(CRET KIN+CKTOT+MB+TROPI)
CK, MB: 1 ng/mL (ref 0.3–4.0)
CK, MB: 1.1 ng/mL (ref 0.3–4.0)
CK, MB: 1.4 ng/mL (ref 0.3–4.0)
Relative Index: INVALID (ref 0.0–2.5)
Relative Index: INVALID (ref 0.0–2.5)
Total CK: 10 U/L (ref 7–177)
Total CK: 11 U/L (ref 7–177)
Troponin I: 0.03 ng/mL (ref 0.00–0.06)

## 2010-04-28 LAB — TSH: TSH: 0.745 u[IU]/mL (ref 0.350–4.500)

## 2010-04-28 LAB — POCT I-STAT, CHEM 8
BUN: 19 mg/dL (ref 6–23)
Calcium, Ion: 1.15 mmol/L (ref 1.12–1.32)
HCT: 40 % (ref 36.0–46.0)
Sodium: 134 mEq/L — ABNORMAL LOW (ref 135–145)
TCO2: 30 mmol/L (ref 0–100)

## 2010-04-28 LAB — HEMOGLOBIN A1C
Hgb A1c MFr Bld: 6.2 % — ABNORMAL HIGH
Mean Plasma Glucose: 131 mg/dL — ABNORMAL HIGH

## 2010-04-28 LAB — COMPREHENSIVE METABOLIC PANEL WITH GFR
ALT: 14 U/L (ref 0–35)
AST: 14 U/L (ref 0–37)
Albumin: 2.8 g/dL — ABNORMAL LOW (ref 3.5–5.2)
Alkaline Phosphatase: 97 U/L (ref 39–117)
BUN: 18 mg/dL (ref 6–23)
CO2: 30 meq/L (ref 19–32)
Calcium: 9.1 mg/dL (ref 8.4–10.5)
Chloride: 97 meq/L (ref 96–112)
Creatinine, Ser: 3.71 mg/dL — ABNORMAL HIGH (ref 0.4–1.2)
GFR calc non Af Amer: 12 mL/min — ABNORMAL LOW
Glucose, Bld: 145 mg/dL — ABNORMAL HIGH (ref 70–99)
Potassium: 3.4 meq/L — ABNORMAL LOW (ref 3.5–5.1)
Sodium: 134 meq/L — ABNORMAL LOW (ref 135–145)
Total Bilirubin: 0.4 mg/dL (ref 0.3–1.2)
Total Protein: 5.8 g/dL — ABNORMAL LOW (ref 6.0–8.3)

## 2010-04-28 LAB — PHOSPHORUS
Phosphorus: 1.2 mg/dL — ABNORMAL LOW (ref 2.3–4.6)
Phosphorus: 2.5 mg/dL (ref 2.3–4.6)
Phosphorus: 2.7 mg/dL (ref 2.3–4.6)
Phosphorus: 3.5 mg/dL (ref 2.3–4.6)
Phosphorus: 5 mg/dL — ABNORMAL HIGH (ref 2.3–4.6)

## 2010-04-28 LAB — PROCALCITONIN

## 2010-04-28 LAB — MAGNESIUM
Magnesium: 1.6 mg/dL (ref 1.5–2.5)
Magnesium: 1.6 mg/dL (ref 1.5–2.5)
Magnesium: 1.8 mg/dL (ref 1.5–2.5)
Magnesium: 1.9 mg/dL (ref 1.5–2.5)
Magnesium: 2.1 mg/dL (ref 1.5–2.5)
Magnesium: 2.1 mg/dL (ref 1.5–2.5)
Magnesium: 2.2 mg/dL (ref 1.5–2.5)

## 2010-04-28 LAB — BRAIN NATRIURETIC PEPTIDE
Pro B Natriuretic peptide (BNP): 729 pg/mL — ABNORMAL HIGH (ref 0.0–100.0)
Pro B Natriuretic peptide (BNP): 956 pg/mL — ABNORMAL HIGH (ref 0.0–100.0)

## 2010-04-28 LAB — VITAMIN B12: Vitamin B-12: >2000 pg/mL — ABNORMAL HIGH (ref 211–911)

## 2010-04-28 LAB — APTT

## 2010-04-29 LAB — POCT I-STAT, CHEM 8
BUN: 14 mg/dL (ref 6–23)
Calcium, Ion: 1.25 mmol/L (ref 1.12–1.32)
Chloride: 98 mEq/L (ref 96–112)
Creatinine, Ser: 2.2 mg/dL — ABNORMAL HIGH (ref 0.4–1.2)
Glucose, Bld: 194 mg/dL — ABNORMAL HIGH (ref 70–99)
TCO2: 32 mmol/L (ref 0–100)

## 2010-04-29 LAB — POCT I-STAT 4, (NA,K, GLUC, HGB,HCT)
HCT: 43 % (ref 36.0–46.0)
Hemoglobin: 14.6 g/dL (ref 12.0–15.0)

## 2010-04-29 LAB — PROTIME-INR
INR: 2.37 — ABNORMAL HIGH (ref 0.00–1.49)
Prothrombin Time: 15.6 seconds — ABNORMAL HIGH (ref 11.6–15.2)

## 2010-04-29 LAB — GLUCOSE, CAPILLARY
Glucose-Capillary: 129 mg/dL — ABNORMAL HIGH (ref 70–99)
Glucose-Capillary: 141 mg/dL — ABNORMAL HIGH (ref 70–99)

## 2010-04-29 LAB — SURGICAL PCR SCREEN
MRSA, PCR: NEGATIVE
Staphylococcus aureus: NEGATIVE

## 2010-04-30 LAB — RENAL FUNCTION PANEL
Albumin: 2.6 g/dL — ABNORMAL LOW (ref 3.5–5.2)
Albumin: 2.6 g/dL — ABNORMAL LOW (ref 3.5–5.2)
Albumin: 2.7 g/dL — ABNORMAL LOW (ref 3.5–5.2)
Albumin: 2.8 g/dL — ABNORMAL LOW (ref 3.5–5.2)
BUN: 13 mg/dL (ref 6–23)
BUN: 5 mg/dL — ABNORMAL LOW (ref 6–23)
CO2: 27 meq/L (ref 19–32)
CO2: 29 mEq/L (ref 19–32)
CO2: 29 meq/L (ref 19–32)
Calcium: 10.1 mg/dL (ref 8.4–10.5)
Calcium: 11.1 mg/dL — ABNORMAL HIGH (ref 8.4–10.5)
Chloride: 101 meq/L (ref 96–112)
Chloride: 103 meq/L (ref 96–112)
Chloride: 106 mEq/L (ref 96–112)
Creatinine, Ser: 2.33 mg/dL — ABNORMAL HIGH (ref 0.4–1.2)
Creatinine, Ser: 3.34 mg/dL — ABNORMAL HIGH (ref 0.4–1.2)
GFR calc Af Amer: 17 mL/min — ABNORMAL LOW (ref >60)
GFR calc Af Amer: 23 mL/min — ABNORMAL LOW (ref >60)
GFR calc non Af Amer: 14 mL/min — ABNORMAL LOW
GFR calc non Af Amer: 19 mL/min — ABNORMAL LOW (ref >60)
GFR calc non Af Amer: 21 mL/min — ABNORMAL LOW
Glucose, Bld: 101 mg/dL — ABNORMAL HIGH (ref 70–99)
Glucose, Bld: 120 mg/dL — ABNORMAL HIGH (ref 70–99)
Phosphorus: 3.2 mg/dL (ref 2.3–4.6)
Phosphorus: 4.4 mg/dL (ref 2.3–4.6)
Phosphorus: 4.5 mg/dL (ref 2.3–4.6)
Potassium: 3.4 mEq/L — ABNORMAL LOW (ref 3.5–5.1)
Potassium: 3.4 meq/L — ABNORMAL LOW (ref 3.5–5.1)
Potassium: 3.9 meq/L (ref 3.5–5.1)
Potassium: 4.1 mEq/L (ref 3.5–5.1)
Sodium: 137 mEq/L (ref 135–145)
Sodium: 137 meq/L (ref 135–145)
Sodium: 140 meq/L (ref 135–145)
Sodium: 141 mEq/L (ref 135–145)

## 2010-04-30 LAB — GLUCOSE, CAPILLARY
Glucose-Capillary: 109 mg/dL — ABNORMAL HIGH (ref 70–99)
Glucose-Capillary: 111 mg/dL — ABNORMAL HIGH (ref 70–99)
Glucose-Capillary: 121 mg/dL — ABNORMAL HIGH (ref 70–99)
Glucose-Capillary: 126 mg/dL — ABNORMAL HIGH (ref 70–99)
Glucose-Capillary: 159 mg/dL — ABNORMAL HIGH (ref 70–99)
Glucose-Capillary: 172 mg/dL — ABNORMAL HIGH (ref 70–99)
Glucose-Capillary: 180 mg/dL — ABNORMAL HIGH (ref 70–99)
Glucose-Capillary: 92 mg/dL (ref 70–99)
Glucose-Capillary: 96 mg/dL (ref 70–99)

## 2010-04-30 LAB — SURGICAL PCR SCREEN
MRSA, PCR: NEGATIVE
Staphylococcus aureus: NEGATIVE

## 2010-04-30 LAB — CBC
HCT: 35.6 % — ABNORMAL LOW (ref 36.0–46.0)
HCT: 35.9 % — ABNORMAL LOW (ref 36.0–46.0)
HCT: 36.9 % (ref 36.0–46.0)
HCT: 37.3 % (ref 36.0–46.0)
Hemoglobin: 10.6 g/dL — ABNORMAL LOW (ref 12.0–15.0)
Hemoglobin: 10.8 g/dL — ABNORMAL LOW (ref 12.0–15.0)
Hemoglobin: 10.8 g/dL — ABNORMAL LOW (ref 12.0–15.0)
Hemoglobin: 10.9 g/dL — ABNORMAL LOW (ref 12.0–15.0)
Hemoglobin: 11 g/dL — ABNORMAL LOW (ref 12.0–15.0)
MCH: 29.5 pg (ref 26.0–34.0)
MCH: 29.5 pg (ref 26.0–34.0)
MCHC: 29.8 g/dL — ABNORMAL LOW (ref 30.0–36.0)
MCHC: 29.9 g/dL — ABNORMAL LOW (ref 30.0–36.0)
MCHC: 30.1 g/dL (ref 30.0–36.0)
MCV: 98.1 fL (ref 78.0–100.0)
MCV: 99.2 fL (ref 78.0–100.0)
MCV: 99.2 fL (ref 78.0–100.0)
MCV: 99.5 fL (ref 78.0–100.0)
Platelets: 107 K/uL — ABNORMAL LOW (ref 150–400)
Platelets: 108 10*3/uL — ABNORMAL LOW (ref 150–400)
Platelets: 116 K/uL — ABNORMAL LOW (ref 150–400)
RBC: 3.59 MIL/uL — ABNORMAL LOW (ref 3.87–5.11)
RBC: 3.66 MIL/uL — ABNORMAL LOW (ref 3.87–5.11)
RBC: 3.68 MIL/uL — ABNORMAL LOW (ref 3.87–5.11)
RBC: 3.72 MIL/uL — ABNORMAL LOW (ref 3.87–5.11)
RDW: 18.1 % — ABNORMAL HIGH (ref 11.5–15.5)
RDW: 18.2 % — ABNORMAL HIGH (ref 11.5–15.5)
RDW: 18.2 % — ABNORMAL HIGH (ref 11.5–15.5)
RDW: 18.3 % — ABNORMAL HIGH (ref 11.5–15.5)
WBC: 4.7 10*3/uL (ref 4.0–10.5)
WBC: 5.6 K/uL (ref 4.0–10.5)
WBC: 5.7 10*3/uL (ref 4.0–10.5)
WBC: 6.7 K/uL (ref 4.0–10.5)

## 2010-04-30 LAB — BASIC METABOLIC PANEL
Calcium: 10 mg/dL (ref 8.4–10.5)
Creatinine, Ser: 2.28 mg/dL — ABNORMAL HIGH (ref 0.4–1.2)
GFR calc Af Amer: 25 mL/min — ABNORMAL LOW (ref >60)
GFR calc non Af Amer: 21 mL/min — ABNORMAL LOW (ref >60)
Glucose, Bld: 132 mg/dL — ABNORMAL HIGH (ref 70–99)
Sodium: 136 mEq/L (ref 135–145)

## 2010-04-30 LAB — PROTIME-INR
INR: 1 (ref 0.00–1.49)
INR: 1.05 (ref 0.00–1.49)
INR: 1.06 (ref 0.00–1.49)
Prothrombin Time: 13.4 seconds (ref 11.6–15.2)
Prothrombin Time: 13.9 s (ref 11.6–15.2)
Prothrombin Time: 14 s (ref 11.6–15.2)

## 2010-04-30 LAB — POCT I-STAT 4, (NA,K, GLUC, HGB,HCT)
Glucose, Bld: 207 mg/dL — ABNORMAL HIGH (ref 70–99)
HCT: 43 % (ref 36.0–46.0)
Hemoglobin: 14.6 g/dL (ref 12.0–15.0)
Potassium: 2.3 meq/L — CL (ref 3.5–5.1)
Sodium: 138 meq/L (ref 135–145)

## 2010-04-30 LAB — HEPARIN LEVEL (UNFRACTIONATED): Heparin Unfractionated: 0.37 IU/mL (ref 0.30–0.70)

## 2010-04-30 LAB — IRON AND TIBC
Saturation Ratios: 14 % — ABNORMAL LOW (ref 20–55)
UIBC: 202 ug/dL

## 2010-04-30 LAB — APTT

## 2010-04-30 LAB — FERRITIN: Ferritin: 908 ng/mL — ABNORMAL HIGH (ref 10–291)

## 2010-04-30 LAB — PTH-RELATED PEPTIDE

## 2010-05-01 LAB — CBC
HCT: 27.7 % — ABNORMAL LOW (ref 36.0–46.0)
HCT: 28.5 % — ABNORMAL LOW (ref 36.0–46.0)
HCT: 29.8 % — ABNORMAL LOW (ref 36.0–46.0)
HCT: 31.9 % — ABNORMAL LOW (ref 36.0–46.0)
HCT: 33 % — ABNORMAL LOW (ref 36.0–46.0)
HCT: 35.6 % — ABNORMAL LOW (ref 36.0–46.0)
HCT: 38 % (ref 36.0–46.0)
Hemoglobin: 10.5 g/dL — ABNORMAL LOW (ref 12.0–15.0)
Hemoglobin: 10.6 g/dL — ABNORMAL LOW (ref 12.0–15.0)
Hemoglobin: 10.6 g/dL — ABNORMAL LOW (ref 12.0–15.0)
Hemoglobin: 11 g/dL — ABNORMAL LOW (ref 12.0–15.0)
Hemoglobin: 11.6 g/dL — ABNORMAL LOW (ref 12.0–15.0)
Hemoglobin: 7.2 g/dL — ABNORMAL LOW (ref 12.0–15.0)
Hemoglobin: 8.2 g/dL — ABNORMAL LOW (ref 12.0–15.0)
Hemoglobin: 9.1 g/dL — ABNORMAL LOW (ref 12.0–15.0)
Hemoglobin: 9.4 g/dL — ABNORMAL LOW (ref 12.0–15.0)
MCH: 29.2 pg (ref 26.0–34.0)
MCH: 29.2 pg (ref 26.0–34.0)
MCH: 29.5 pg (ref 26.0–34.0)
MCH: 29.8 pg (ref 26.0–34.0)
MCH: 29.9 pg (ref 26.0–34.0)
MCH: 30.3 pg (ref 26.0–34.0)
MCHC: 29.9 g/dL — ABNORMAL LOW (ref 30.0–36.0)
MCHC: 30.1 g/dL (ref 30.0–36.0)
MCHC: 30.3 g/dL (ref 30.0–36.0)
MCHC: 30.9 g/dL (ref 30.0–36.0)
MCHC: 32 g/dL (ref 30.0–36.0)
MCV: 95.5 fL (ref 78.0–100.0)
MCV: 96.4 fL (ref 78.0–100.0)
MCV: 97.9 fL (ref 78.0–100.0)
MCV: 98.5 fL (ref 78.0–100.0)
MCV: 98.9 fL (ref 78.0–100.0)
MCV: 99 fL (ref 78.0–100.0)
MCV: 99 fL (ref 78.0–100.0)
MCV: 99.2 fL (ref 78.0–100.0)
Platelets: 115 10*3/uL — ABNORMAL LOW (ref 150–400)
Platelets: 131 10*3/uL — ABNORMAL LOW (ref 150–400)
Platelets: 133 10*3/uL — ABNORMAL LOW (ref 150–400)
Platelets: 164 10*3/uL (ref 150–400)
Platelets: 188 10*3/uL (ref 150–400)
Platelets: 223 10*3/uL (ref 150–400)
Platelets: 224 10*3/uL (ref 150–400)
Platelets: 224 10*3/uL (ref 150–400)
Platelets: 243 10*3/uL (ref 150–400)
Platelets: 261 10*3/uL (ref 150–400)
RBC: 2.48 MIL/uL — ABNORMAL LOW (ref 3.87–5.11)
RBC: 2.83 MIL/uL — ABNORMAL LOW (ref 3.87–5.11)
RBC: 2.85 MIL/uL — ABNORMAL LOW (ref 3.87–5.11)
RBC: 3.23 MIL/uL — ABNORMAL LOW (ref 3.87–5.11)
RBC: 3.31 MIL/uL — ABNORMAL LOW (ref 3.87–5.11)
RBC: 3.37 MIL/uL — ABNORMAL LOW (ref 3.87–5.11)
RBC: 3.38 MIL/uL — ABNORMAL LOW (ref 3.87–5.11)
RBC: 3.43 MIL/uL — ABNORMAL LOW (ref 3.87–5.11)
RBC: 3.6 MIL/uL — ABNORMAL LOW (ref 3.87–5.11)
RBC: 3.84 MIL/uL — ABNORMAL LOW (ref 3.87–5.11)
RDW: 17 % — ABNORMAL HIGH (ref 11.5–15.5)
RDW: 17.1 % — ABNORMAL HIGH (ref 11.5–15.5)
RDW: 17.3 % — ABNORMAL HIGH (ref 11.5–15.5)
RDW: 18.3 % — ABNORMAL HIGH (ref 11.5–15.5)
RDW: 18.3 % — ABNORMAL HIGH (ref 11.5–15.5)
RDW: 18.4 % — ABNORMAL HIGH (ref 11.5–15.5)
WBC: 10.3 10*3/uL (ref 4.0–10.5)
WBC: 11.5 10*3/uL — ABNORMAL HIGH (ref 4.0–10.5)
WBC: 14.1 10*3/uL — ABNORMAL HIGH (ref 4.0–10.5)
WBC: 6.2 10*3/uL (ref 4.0–10.5)
WBC: 6.6 10*3/uL (ref 4.0–10.5)
WBC: 6.9 10*3/uL (ref 4.0–10.5)
WBC: 7.6 10*3/uL (ref 4.0–10.5)
WBC: 7.9 10*3/uL (ref 4.0–10.5)
WBC: 8.1 10*3/uL (ref 4.0–10.5)

## 2010-05-01 LAB — POCT I-STAT 3, ART BLOOD GAS (G3+)
Acid-Base Excess: 12 mmol/L — ABNORMAL HIGH (ref 0.0–2.0)
Acid-Base Excess: 14 mmol/L — ABNORMAL HIGH (ref 0.0–2.0)
Acid-Base Excess: 2 mmol/L (ref 0.0–2.0)
Acid-Base Excess: 2 mmol/L (ref 0.0–2.0)
Acid-Base Excess: 3 mmol/L — ABNORMAL HIGH (ref 0.0–2.0)
Acid-Base Excess: 3 mmol/L — ABNORMAL HIGH (ref 0.0–2.0)
Acid-Base Excess: 3 mmol/L — ABNORMAL HIGH (ref 0.0–2.0)
Bicarbonate: 29.5 mEq/L — ABNORMAL HIGH (ref 20.0–24.0)
Bicarbonate: 29.6 mEq/L — ABNORMAL HIGH (ref 20.0–24.0)
Bicarbonate: 31.4 mEq/L — ABNORMAL HIGH (ref 20.0–24.0)
Bicarbonate: 35.1 mEq/L — ABNORMAL HIGH (ref 20.0–24.0)
Bicarbonate: 36.1 mEq/L — ABNORMAL HIGH (ref 20.0–24.0)
Bicarbonate: 38 mEq/L — ABNORMAL HIGH (ref 20.0–24.0)
Bicarbonate: 38.1 mEq/L — ABNORMAL HIGH (ref 20.0–24.0)
O2 Saturation: 100 %
O2 Saturation: 79 %
O2 Saturation: 80 %
O2 Saturation: 88 %
O2 Saturation: 92 %
O2 Saturation: 95 %
O2 Saturation: 98 %
O2 Saturation: 99 %
Patient temperature: 37
Patient temperature: 97.9
Patient temperature: 98
Patient temperature: 98.2
Patient temperature: 98.4
Patient temperature: 98.4
TCO2: 30 mmol/L (ref 0–100)
TCO2: 30 mmol/L (ref 0–100)
TCO2: 31 mmol/L (ref 0–100)
TCO2: 32 mmol/L (ref 0–100)
TCO2: 32 mmol/L (ref 0–100)
TCO2: 34 mmol/L (ref 0–100)
TCO2: 38 mmol/L (ref 0–100)
TCO2: 38 mmol/L (ref 0–100)
TCO2: 39 mmol/L (ref 0–100)
TCO2: 40 mmol/L (ref 0–100)
pCO2 arterial: 40.2 mmHg (ref 35.0–45.0)
pCO2 arterial: 43.4 mmHg (ref 35.0–45.0)
pCO2 arterial: 45.5 mmHg — ABNORMAL HIGH (ref 35.0–45.0)
pCO2 arterial: 46.2 mmHg — ABNORMAL HIGH (ref 35.0–45.0)
pH, Arterial: 7.256 — ABNORMAL LOW (ref 7.350–7.400)
pH, Arterial: 7.372 (ref 7.350–7.400)
pH, Arterial: 7.397 (ref 7.350–7.400)
pH, Arterial: 7.401 — ABNORMAL HIGH (ref 7.350–7.400)
pH, Arterial: 7.497 — ABNORMAL HIGH (ref 7.350–7.400)
pH, Arterial: 7.524 — ABNORMAL HIGH (ref 7.350–7.400)
pO2, Arterial: 110 mmHg — ABNORMAL HIGH (ref 80.0–100.0)
pO2, Arterial: 128 mmHg — ABNORMAL HIGH (ref 80.0–100.0)
pO2, Arterial: 134 mmHg — ABNORMAL HIGH (ref 80.0–100.0)
pO2, Arterial: 295 mmHg — ABNORMAL HIGH (ref 80.0–100.0)

## 2010-05-01 LAB — RENAL FUNCTION PANEL
Albumin: 2.6 g/dL — ABNORMAL LOW (ref 3.5–5.2)
Albumin: 2.8 g/dL — ABNORMAL LOW (ref 3.5–5.2)
Albumin: 2.8 g/dL — ABNORMAL LOW (ref 3.5–5.2)
Albumin: 2.9 g/dL — ABNORMAL LOW (ref 3.5–5.2)
Albumin: 2.9 g/dL — ABNORMAL LOW (ref 3.5–5.2)
Albumin: 3 g/dL — ABNORMAL LOW (ref 3.5–5.2)
BUN: 10 mg/dL (ref 6–23)
BUN: 11 mg/dL (ref 6–23)
BUN: 12 mg/dL (ref 6–23)
BUN: 13 mg/dL (ref 6–23)
BUN: 19 mg/dL (ref 6–23)
BUN: 25 mg/dL — ABNORMAL HIGH (ref 6–23)
BUN: 38 mg/dL — ABNORMAL HIGH (ref 6–23)
BUN: 6 mg/dL (ref 6–23)
BUN: 7 mg/dL (ref 6–23)
CO2: 25 mEq/L (ref 19–32)
CO2: 25 mEq/L (ref 19–32)
CO2: 26 mEq/L (ref 19–32)
CO2: 27 mEq/L (ref 19–32)
CO2: 27 mEq/L (ref 19–32)
CO2: 28 mEq/L (ref 19–32)
CO2: 30 mEq/L (ref 19–32)
Calcium: 8.6 mg/dL (ref 8.4–10.5)
Calcium: 8.8 mg/dL (ref 8.4–10.5)
Calcium: 9.2 mg/dL (ref 8.4–10.5)
Calcium: 9.4 mg/dL (ref 8.4–10.5)
Calcium: 9.8 mg/dL (ref 8.4–10.5)
Calcium: 9.9 mg/dL (ref 8.4–10.5)
Chloride: 101 mEq/L (ref 96–112)
Chloride: 102 mEq/L (ref 96–112)
Chloride: 97 mEq/L (ref 96–112)
Chloride: 97 mEq/L (ref 96–112)
Chloride: 99 mEq/L (ref 96–112)
Chloride: 99 mEq/L (ref 96–112)
Chloride: 99 mEq/L (ref 96–112)
Creatinine, Ser: 1.11 mg/dL (ref 0.4–1.2)
Creatinine, Ser: 1.16 mg/dL (ref 0.4–1.2)
Creatinine, Ser: 2.17 mg/dL — ABNORMAL HIGH (ref 0.4–1.2)
Creatinine, Ser: 2.45 mg/dL — ABNORMAL HIGH (ref 0.4–1.2)
GFR calc Af Amer: 23 mL/min — ABNORMAL LOW (ref >60)
GFR calc Af Amer: 41 mL/min — ABNORMAL LOW (ref >60)
GFR calc Af Amer: 57 mL/min — ABNORMAL LOW (ref >60)
GFR calc Af Amer: 58 mL/min — ABNORMAL LOW (ref >60)
GFR calc Af Amer: >60 mL/min (ref >60)
GFR calc non Af Amer: 19 mL/min — ABNORMAL LOW (ref >60)
GFR calc non Af Amer: 21 mL/min — ABNORMAL LOW (ref >60)
GFR calc non Af Amer: 34 mL/min — ABNORMAL LOW (ref >60)
GFR calc non Af Amer: 47 mL/min — ABNORMAL LOW (ref >60)
GFR calc non Af Amer: 50 mL/min — ABNORMAL LOW (ref >60)
Glucose, Bld: 116 mg/dL — ABNORMAL HIGH (ref 70–99)
Glucose, Bld: 116 mg/dL — ABNORMAL HIGH (ref 70–99)
Glucose, Bld: 119 mg/dL — ABNORMAL HIGH (ref 70–99)
Glucose, Bld: 128 mg/dL — ABNORMAL HIGH (ref 70–99)
Glucose, Bld: 131 mg/dL — ABNORMAL HIGH (ref 70–99)
Glucose, Bld: 138 mg/dL — ABNORMAL HIGH (ref 70–99)
Glucose, Bld: 150 mg/dL — ABNORMAL HIGH (ref 70–99)
Glucose, Bld: 152 mg/dL — ABNORMAL HIGH (ref 70–99)
Glucose, Bld: 152 mg/dL — ABNORMAL HIGH (ref 70–99)
Phosphorus: 1.5 mg/dL — ABNORMAL LOW (ref 2.3–4.6)
Phosphorus: 2.8 mg/dL (ref 2.3–4.6)
Phosphorus: 2.9 mg/dL (ref 2.3–4.6)
Phosphorus: 2.9 mg/dL (ref 2.3–4.6)
Phosphorus: 3.1 mg/dL (ref 2.3–4.6)
Phosphorus: 4.2 mg/dL (ref 2.3–4.6)
Phosphorus: 4.3 mg/dL (ref 2.3–4.6)
Potassium: 3.2 mEq/L — ABNORMAL LOW (ref 3.5–5.1)
Potassium: 3.7 mEq/L (ref 3.5–5.1)
Potassium: 3.8 mEq/L (ref 3.5–5.1)
Potassium: 3.9 mEq/L (ref 3.5–5.1)
Potassium: 4 mEq/L (ref 3.5–5.1)
Potassium: 4.2 mEq/L (ref 3.5–5.1)
Potassium: 4.4 mEq/L (ref 3.5–5.1)
Potassium: 4.4 mEq/L (ref 3.5–5.1)
Potassium: 4.6 mEq/L (ref 3.5–5.1)
Sodium: 131 mEq/L — ABNORMAL LOW (ref 135–145)
Sodium: 132 mEq/L — ABNORMAL LOW (ref 135–145)
Sodium: 132 mEq/L — ABNORMAL LOW (ref 135–145)
Sodium: 134 mEq/L — ABNORMAL LOW (ref 135–145)
Sodium: 134 mEq/L — ABNORMAL LOW (ref 135–145)
Sodium: 134 mEq/L — ABNORMAL LOW (ref 135–145)
Sodium: 134 mEq/L — ABNORMAL LOW (ref 135–145)
Sodium: 135 mEq/L (ref 135–145)
Sodium: 136 mEq/L (ref 135–145)
Sodium: 136 mEq/L (ref 135–145)
Sodium: 137 mEq/L (ref 135–145)
Sodium: 138 mEq/L (ref 135–145)

## 2010-05-01 LAB — APTT
aPTT: 105 seconds — ABNORMAL HIGH (ref 24–37)
aPTT: 166 seconds — ABNORMAL HIGH (ref 24–37)
aPTT: 49 seconds — ABNORMAL HIGH (ref 24–37)
aPTT: 66 seconds — ABNORMAL HIGH (ref 24–37)
aPTT: >200 seconds (ref 24–37)

## 2010-05-01 LAB — COMPREHENSIVE METABOLIC PANEL
ALT: 14 U/L (ref 0–35)
ALT: 16 U/L (ref 0–35)
AST: 17 U/L (ref 0–37)
AST: 19 U/L (ref 0–37)
AST: 19 U/L (ref 0–37)
Albumin: 2.4 g/dL — ABNORMAL LOW (ref 3.5–5.2)
Albumin: 2.4 g/dL — ABNORMAL LOW (ref 3.5–5.2)
Albumin: 2.4 g/dL — ABNORMAL LOW (ref 3.5–5.2)
Alkaline Phosphatase: 71 U/L (ref 39–117)
Alkaline Phosphatase: 77 U/L (ref 39–117)
BUN: 46 mg/dL — ABNORMAL HIGH (ref 6–23)
BUN: 49 mg/dL — ABNORMAL HIGH (ref 6–23)
CO2: 27 mEq/L (ref 19–32)
CO2: 34 mEq/L — ABNORMAL HIGH (ref 19–32)
Calcium: 9.3 mg/dL (ref 8.4–10.5)
Calcium: 9.8 mg/dL (ref 8.4–10.5)
Chloride: 102 mEq/L (ref 96–112)
Chloride: 87 mEq/L — ABNORMAL LOW (ref 96–112)
Creatinine, Ser: 2.76 mg/dL — ABNORMAL HIGH (ref 0.4–1.2)
Creatinine, Ser: 3.43 mg/dL — ABNORMAL HIGH (ref 0.4–1.2)
GFR calc Af Amer: 14 mL/min — ABNORMAL LOW (ref >60)
GFR calc Af Amer: 25 mL/min — ABNORMAL LOW (ref >60)
GFR calc Af Amer: 47 mL/min — ABNORMAL LOW (ref >60)
GFR calc non Af Amer: 20 mL/min — ABNORMAL LOW (ref >60)
GFR calc non Af Amer: 39 mL/min — ABNORMAL LOW (ref >60)
Glucose, Bld: 151 mg/dL — ABNORMAL HIGH (ref 70–99)
Glucose, Bld: 155 mg/dL — ABNORMAL HIGH (ref 70–99)
Potassium: 4 mEq/L (ref 3.5–5.1)
Potassium: 4.1 mEq/L (ref 3.5–5.1)
Sodium: 131 mEq/L — ABNORMAL LOW (ref 135–145)
Sodium: 132 mEq/L — ABNORMAL LOW (ref 135–145)
Sodium: 136 mEq/L (ref 135–145)
Total Bilirubin: 0.3 mg/dL (ref 0.3–1.2)
Total Bilirubin: 0.4 mg/dL (ref 0.3–1.2)
Total Bilirubin: 0.5 mg/dL (ref 0.3–1.2)
Total Bilirubin: 0.8 mg/dL (ref 0.3–1.2)
Total Protein: 5.4 g/dL — ABNORMAL LOW (ref 6.0–8.3)
Total Protein: 5.7 g/dL — ABNORMAL LOW (ref 6.0–8.3)
Total Protein: 5.8 g/dL — ABNORMAL LOW (ref 6.0–8.3)
Total Protein: 6.1 g/dL (ref 6.0–8.3)

## 2010-05-01 LAB — BLOOD GAS, ARTERIAL
FIO2: 0.4 %
Patient temperature: 98.6
Pressure support: 15 cmH2O
TCO2: 36.2 mmol/L (ref 0–100)
pCO2 arterial: 63.4 mmHg (ref 35.0–45.0)
pH, Arterial: 7.352 (ref 7.350–7.400)

## 2010-05-01 LAB — IRON AND TIBC
Saturation Ratios: 8 % — ABNORMAL LOW (ref 20–55)
TIBC: 287 ug/dL (ref 250–470)
UIBC: 263 ug/dL

## 2010-05-01 LAB — GLUCOSE, CAPILLARY
Glucose-Capillary: 103 mg/dL — ABNORMAL HIGH (ref 70–99)
Glucose-Capillary: 109 mg/dL — ABNORMAL HIGH (ref 70–99)
Glucose-Capillary: 113 mg/dL — ABNORMAL HIGH (ref 70–99)
Glucose-Capillary: 114 mg/dL — ABNORMAL HIGH (ref 70–99)
Glucose-Capillary: 114 mg/dL — ABNORMAL HIGH (ref 70–99)
Glucose-Capillary: 121 mg/dL — ABNORMAL HIGH (ref 70–99)
Glucose-Capillary: 125 mg/dL — ABNORMAL HIGH (ref 70–99)
Glucose-Capillary: 127 mg/dL — ABNORMAL HIGH (ref 70–99)
Glucose-Capillary: 128 mg/dL — ABNORMAL HIGH (ref 70–99)
Glucose-Capillary: 133 mg/dL — ABNORMAL HIGH (ref 70–99)
Glucose-Capillary: 133 mg/dL — ABNORMAL HIGH (ref 70–99)
Glucose-Capillary: 134 mg/dL — ABNORMAL HIGH (ref 70–99)
Glucose-Capillary: 136 mg/dL — ABNORMAL HIGH (ref 70–99)
Glucose-Capillary: 137 mg/dL — ABNORMAL HIGH (ref 70–99)
Glucose-Capillary: 138 mg/dL — ABNORMAL HIGH (ref 70–99)
Glucose-Capillary: 140 mg/dL — ABNORMAL HIGH (ref 70–99)
Glucose-Capillary: 140 mg/dL — ABNORMAL HIGH (ref 70–99)
Glucose-Capillary: 140 mg/dL — ABNORMAL HIGH (ref 70–99)
Glucose-Capillary: 141 mg/dL — ABNORMAL HIGH (ref 70–99)
Glucose-Capillary: 142 mg/dL — ABNORMAL HIGH (ref 70–99)
Glucose-Capillary: 145 mg/dL — ABNORMAL HIGH (ref 70–99)
Glucose-Capillary: 145 mg/dL — ABNORMAL HIGH (ref 70–99)
Glucose-Capillary: 145 mg/dL — ABNORMAL HIGH (ref 70–99)
Glucose-Capillary: 146 mg/dL — ABNORMAL HIGH (ref 70–99)
Glucose-Capillary: 149 mg/dL — ABNORMAL HIGH (ref 70–99)
Glucose-Capillary: 149 mg/dL — ABNORMAL HIGH (ref 70–99)
Glucose-Capillary: 153 mg/dL — ABNORMAL HIGH (ref 70–99)
Glucose-Capillary: 153 mg/dL — ABNORMAL HIGH (ref 70–99)
Glucose-Capillary: 156 mg/dL — ABNORMAL HIGH (ref 70–99)
Glucose-Capillary: 157 mg/dL — ABNORMAL HIGH (ref 70–99)
Glucose-Capillary: 157 mg/dL — ABNORMAL HIGH (ref 70–99)
Glucose-Capillary: 172 mg/dL — ABNORMAL HIGH (ref 70–99)
Glucose-Capillary: 176 mg/dL — ABNORMAL HIGH (ref 70–99)
Glucose-Capillary: 179 mg/dL — ABNORMAL HIGH (ref 70–99)
Glucose-Capillary: 188 mg/dL — ABNORMAL HIGH (ref 70–99)
Glucose-Capillary: 191 mg/dL — ABNORMAL HIGH (ref 70–99)
Glucose-Capillary: 204 mg/dL — ABNORMAL HIGH (ref 70–99)
Glucose-Capillary: 214 mg/dL — ABNORMAL HIGH (ref 70–99)
Glucose-Capillary: 219 mg/dL — ABNORMAL HIGH (ref 70–99)
Glucose-Capillary: 95 mg/dL (ref 70–99)

## 2010-05-01 LAB — CULTURE, BLOOD (ROUTINE X 2): Culture: NO GROWTH

## 2010-05-01 LAB — HEPARIN LEVEL (UNFRACTIONATED)
Heparin Unfractionated: 0.34 IU/mL (ref 0.30–0.70)
Heparin Unfractionated: 0.34 IU/mL (ref 0.30–0.70)
Heparin Unfractionated: 0.36 IU/mL (ref 0.30–0.70)
Heparin Unfractionated: 0.45 IU/mL (ref 0.30–0.70)
Heparin Unfractionated: 0.46 IU/mL (ref 0.30–0.70)
Heparin Unfractionated: 0.55 IU/mL (ref 0.30–0.70)
Heparin Unfractionated: 0.6 IU/mL (ref 0.30–0.70)

## 2010-05-01 LAB — URINE CULTURE

## 2010-05-01 LAB — PREPARE FRESH FROZEN PLASMA

## 2010-05-01 LAB — CARDIAC PANEL(CRET KIN+CKTOT+MB+TROPI)
CK, MB: 1.1 ng/mL (ref 0.3–4.0)
CK, MB: 1.8 ng/mL (ref 0.3–4.0)
Relative Index: INVALID (ref 0.0–2.5)
Relative Index: INVALID (ref 0.0–2.5)
Total CK: 23 U/L (ref 7–177)
Total CK: 40 U/L (ref 7–177)
Troponin I: 0.05 ng/mL (ref 0.00–0.06)

## 2010-05-01 LAB — PROTIME-INR
INR: 1 (ref 0.00–1.49)
INR: 1.04 (ref 0.00–1.49)
INR: 1.11 (ref 0.00–1.49)
INR: 1.61 — ABNORMAL HIGH (ref 0.00–1.49)
INR: 1.63 — ABNORMAL HIGH (ref 0.00–1.49)
INR: 1.85 — ABNORMAL HIGH (ref 0.00–1.49)
INR: 1.98 — ABNORMAL HIGH (ref 0.00–1.49)
INR: 2.89 — ABNORMAL HIGH (ref 0.00–1.49)
INR: 3.63 — ABNORMAL HIGH (ref 0.00–1.49)
INR: 4.08 — ABNORMAL HIGH (ref 0.00–1.49)
Prothrombin Time: 13.8 seconds (ref 11.6–15.2)
Prothrombin Time: 14.5 seconds (ref 11.6–15.2)
Prothrombin Time: 16.2 seconds — ABNORMAL HIGH (ref 11.6–15.2)
Prothrombin Time: 19.3 seconds — ABNORMAL HIGH (ref 11.6–15.2)
Prothrombin Time: 21.5 seconds — ABNORMAL HIGH (ref 11.6–15.2)
Prothrombin Time: 30.3 seconds — ABNORMAL HIGH (ref 11.6–15.2)
Prothrombin Time: 36.1 seconds — ABNORMAL HIGH (ref 11.6–15.2)
Prothrombin Time: 39.3 seconds — ABNORMAL HIGH (ref 11.6–15.2)

## 2010-05-01 LAB — LACTIC ACID, PLASMA: Lactic Acid, Venous: 1.3 mmol/L (ref 0.5–2.2)

## 2010-05-01 LAB — URINALYSIS, MICROSCOPIC ONLY
Glucose, UA: NEGATIVE mg/dL
Ketones, ur: 15 mg/dL — AB
Specific Gravity, Urine: 1.018 (ref 1.005–1.030)
pH: 5 (ref 5.0–8.0)

## 2010-05-01 LAB — DIFFERENTIAL
Basophils Absolute: 0 10*3/uL (ref 0.0–0.1)
Basophils Relative: 0 % (ref 0–1)
Eosinophils Relative: 2 % (ref 0–5)
Monocytes Absolute: 0.6 10*3/uL (ref 0.1–1.0)
Monocytes Relative: 9 % (ref 3–12)
Neutro Abs: 4.5 10*3/uL (ref 1.7–7.7)

## 2010-05-01 LAB — URINALYSIS, ROUTINE W REFLEX MICROSCOPIC
Glucose, UA: NEGATIVE mg/dL
Specific Gravity, Urine: 1.024 (ref 1.005–1.030)
pH: 5 (ref 5.0–8.0)

## 2010-05-01 LAB — BASIC METABOLIC PANEL
BUN: 13 mg/dL (ref 6–23)
Chloride: 103 mEq/L (ref 96–112)
Glucose, Bld: 112 mg/dL — ABNORMAL HIGH (ref 70–99)
Potassium: 3.8 mEq/L (ref 3.5–5.1)
Sodium: 136 mEq/L (ref 135–145)

## 2010-05-01 LAB — CROSSMATCH: ABO/RH(D): O NEG

## 2010-05-01 LAB — CULTURE, RESPIRATORY W GRAM STAIN

## 2010-05-01 LAB — URINE MICROSCOPIC-ADD ON

## 2010-05-01 LAB — CREATININE, URINE, RANDOM: Creatinine, Urine: 127.4 mg/dL

## 2010-05-01 LAB — MAGNESIUM
Magnesium: 2.4 mg/dL (ref 1.5–2.5)
Magnesium: 2.5 mg/dL (ref 1.5–2.5)

## 2010-05-01 LAB — MRSA PCR SCREENING: MRSA by PCR: NEGATIVE

## 2010-05-01 LAB — FLECAINIDE LEVEL

## 2010-05-01 LAB — BRAIN NATRIURETIC PEPTIDE
Pro B Natriuretic peptide (BNP): 1116 pg/mL — ABNORMAL HIGH (ref 0.0–100.0)
Pro B Natriuretic peptide (BNP): 741 pg/mL — ABNORMAL HIGH (ref 0.0–100.0)

## 2010-05-01 LAB — PHOSPHORUS: Phosphorus: 2.6 mg/dL (ref 2.3–4.6)

## 2010-05-01 LAB — ALT: ALT: 16 U/L (ref 0–35)

## 2010-05-01 LAB — PREALBUMIN: Prealbumin: 21.1 mg/dL (ref 18.0–45.0)

## 2010-05-02 LAB — POCT I-STAT 3, ART BLOOD GAS (G3+)
Acid-Base Excess: 11 mmol/L — ABNORMAL HIGH (ref 0.0–2.0)
Acid-Base Excess: 14 mmol/L — ABNORMAL HIGH (ref 0.0–2.0)
Acid-Base Excess: 8 mmol/L — ABNORMAL HIGH (ref 0.0–2.0)
Acid-Base Excess: 9 mmol/L — ABNORMAL HIGH (ref 0.0–2.0)
Bicarbonate: 36.1 mEq/L — ABNORMAL HIGH (ref 20.0–24.0)
Bicarbonate: 36.2 mEq/L — ABNORMAL HIGH (ref 20.0–24.0)
Bicarbonate: 36.9 mEq/L — ABNORMAL HIGH (ref 20.0–24.0)
Bicarbonate: 38.4 mEq/L — ABNORMAL HIGH (ref 20.0–24.0)
O2 Saturation: 100 %
O2 Saturation: 66 %
O2 Saturation: 90 %
O2 Saturation: 99 %
Patient temperature: 33
Patient temperature: 37
Patient temperature: 37
TCO2: 39 mmol/L (ref 0–100)
TCO2: 40 mmol/L (ref 0–100)
TCO2: 42 mmol/L (ref 0–100)
TCO2: 43 mmol/L (ref 0–100)
pCO2 arterial: 115.8 mmHg (ref 35.0–45.0)
pCO2 arterial: 49.8 mmHg — ABNORMAL HIGH (ref 35.0–45.0)
pCO2 arterial: 50.1 mmHg — ABNORMAL HIGH (ref 35.0–45.0)
pH, Arterial: 7.144 — CL (ref 7.350–7.400)
pH, Arterial: 7.392 (ref 7.350–7.400)
pH, Arterial: 7.503 — ABNORMAL HIGH (ref 7.350–7.400)
pO2, Arterial: 130 mmHg — ABNORMAL HIGH (ref 80.0–100.0)
pO2, Arterial: 38 mmHg — CL (ref 80.0–100.0)
pO2, Arterial: 388 mmHg — ABNORMAL HIGH (ref 80.0–100.0)

## 2010-05-02 LAB — PHOSPHORUS
Phosphorus: 3.6 mg/dL (ref 2.3–4.6)
Phosphorus: 3.9 mg/dL (ref 2.3–4.6)
Phosphorus: 4.7 mg/dL — ABNORMAL HIGH (ref 2.3–4.6)

## 2010-05-02 LAB — GLUCOSE, CAPILLARY
Glucose-Capillary: 103 mg/dL — ABNORMAL HIGH (ref 70–99)
Glucose-Capillary: 106 mg/dL — ABNORMAL HIGH (ref 70–99)
Glucose-Capillary: 121 mg/dL — ABNORMAL HIGH (ref 70–99)
Glucose-Capillary: 139 mg/dL — ABNORMAL HIGH (ref 70–99)
Glucose-Capillary: 145 mg/dL — ABNORMAL HIGH (ref 70–99)
Glucose-Capillary: 147 mg/dL — ABNORMAL HIGH (ref 70–99)
Glucose-Capillary: 150 mg/dL — ABNORMAL HIGH (ref 70–99)
Glucose-Capillary: 157 mg/dL — ABNORMAL HIGH (ref 70–99)
Glucose-Capillary: 158 mg/dL — ABNORMAL HIGH (ref 70–99)
Glucose-Capillary: 159 mg/dL — ABNORMAL HIGH (ref 70–99)
Glucose-Capillary: 162 mg/dL — ABNORMAL HIGH (ref 70–99)
Glucose-Capillary: 166 mg/dL — ABNORMAL HIGH (ref 70–99)
Glucose-Capillary: 168 mg/dL — ABNORMAL HIGH (ref 70–99)
Glucose-Capillary: 171 mg/dL — ABNORMAL HIGH (ref 70–99)
Glucose-Capillary: 175 mg/dL — ABNORMAL HIGH (ref 70–99)
Glucose-Capillary: 175 mg/dL — ABNORMAL HIGH (ref 70–99)
Glucose-Capillary: 175 mg/dL — ABNORMAL HIGH (ref 70–99)
Glucose-Capillary: 178 mg/dL — ABNORMAL HIGH (ref 70–99)
Glucose-Capillary: 191 mg/dL — ABNORMAL HIGH (ref 70–99)
Glucose-Capillary: 192 mg/dL — ABNORMAL HIGH (ref 70–99)
Glucose-Capillary: 192 mg/dL — ABNORMAL HIGH (ref 70–99)
Glucose-Capillary: 192 mg/dL — ABNORMAL HIGH (ref 70–99)
Glucose-Capillary: 199 mg/dL — ABNORMAL HIGH (ref 70–99)
Glucose-Capillary: 200 mg/dL — ABNORMAL HIGH (ref 70–99)
Glucose-Capillary: 207 mg/dL — ABNORMAL HIGH (ref 70–99)
Glucose-Capillary: 207 mg/dL — ABNORMAL HIGH (ref 70–99)
Glucose-Capillary: 207 mg/dL — ABNORMAL HIGH (ref 70–99)
Glucose-Capillary: 211 mg/dL — ABNORMAL HIGH (ref 70–99)
Glucose-Capillary: 215 mg/dL — ABNORMAL HIGH (ref 70–99)
Glucose-Capillary: 220 mg/dL — ABNORMAL HIGH (ref 70–99)
Glucose-Capillary: 223 mg/dL — ABNORMAL HIGH (ref 70–99)
Glucose-Capillary: 244 mg/dL — ABNORMAL HIGH (ref 70–99)
Glucose-Capillary: 245 mg/dL — ABNORMAL HIGH (ref 70–99)
Glucose-Capillary: 259 mg/dL — ABNORMAL HIGH (ref 70–99)
Glucose-Capillary: 259 mg/dL — ABNORMAL HIGH (ref 70–99)
Glucose-Capillary: 260 mg/dL — ABNORMAL HIGH (ref 70–99)
Glucose-Capillary: 264 mg/dL — ABNORMAL HIGH (ref 70–99)
Glucose-Capillary: 267 mg/dL — ABNORMAL HIGH (ref 70–99)
Glucose-Capillary: 276 mg/dL — ABNORMAL HIGH (ref 70–99)
Glucose-Capillary: 326 mg/dL — ABNORMAL HIGH (ref 70–99)
Glucose-Capillary: 335 mg/dL — ABNORMAL HIGH (ref 70–99)
Glucose-Capillary: 339 mg/dL — ABNORMAL HIGH (ref 70–99)
Glucose-Capillary: 373 mg/dL — ABNORMAL HIGH (ref 70–99)

## 2010-05-02 LAB — CBC
HCT: 24.7 % — ABNORMAL LOW (ref 36.0–46.0)
HCT: 25.9 % — ABNORMAL LOW (ref 36.0–46.0)
HCT: 26.4 % — ABNORMAL LOW (ref 36.0–46.0)
HCT: 33.1 % — ABNORMAL LOW (ref 36.0–46.0)
HCT: 34.3 % — ABNORMAL LOW (ref 36.0–46.0)
Hemoglobin: 10.4 g/dL — ABNORMAL LOW (ref 12.0–15.0)
Hemoglobin: 8.3 g/dL — ABNORMAL LOW (ref 12.0–15.0)
Hemoglobin: 8.5 g/dL — ABNORMAL LOW (ref 12.0–15.0)
Hemoglobin: 8.6 g/dL — ABNORMAL LOW (ref 12.0–15.0)
Hemoglobin: 8.9 g/dL — ABNORMAL LOW (ref 12.0–15.0)
Hemoglobin: 9 g/dL — ABNORMAL LOW (ref 12.0–15.0)
MCH: 29.8 pg (ref 26.0–34.0)
MCH: 30 pg (ref 26.0–34.0)
MCH: 30.3 pg (ref 26.0–34.0)
MCH: 30.3 pg (ref 26.0–34.0)
MCH: 30.3 pg (ref 26.0–34.0)
MCH: 30.5 pg (ref 26.0–34.0)
MCH: 30.5 pg (ref 26.0–34.0)
MCH: 30.7 pg (ref 26.0–34.0)
MCHC: 31.5 g/dL (ref 30.0–36.0)
MCHC: 31.5 g/dL (ref 30.0–36.0)
MCHC: 31.6 g/dL (ref 30.0–36.0)
MCHC: 31.7 g/dL (ref 30.0–36.0)
MCHC: 31.7 g/dL (ref 30.0–36.0)
MCHC: 31.8 g/dL (ref 30.0–36.0)
MCHC: 32 g/dL (ref 30.0–36.0)
MCHC: 32.3 g/dL (ref 30.0–36.0)
MCV: 95.2 fL (ref 78.0–100.0)
MCV: 95.3 fL (ref 78.0–100.0)
MCV: 95.5 fL (ref 78.0–100.0)
MCV: 96.3 fL (ref 78.0–100.0)
MCV: 96.5 fL (ref 78.0–100.0)
MCV: 96.6 fL (ref 78.0–100.0)
Platelets: 121 10*3/uL — ABNORMAL LOW (ref 150–400)
Platelets: 146 10*3/uL — ABNORMAL LOW (ref 150–400)
Platelets: 148 10*3/uL — ABNORMAL LOW (ref 150–400)
Platelets: 156 10*3/uL (ref 150–400)
Platelets: 160 10*3/uL (ref 150–400)
Platelets: 168 10*3/uL (ref 150–400)
Platelets: 171 10*3/uL (ref 150–400)
Platelets: 183 10*3/uL (ref 150–400)
Platelets: 192 10*3/uL (ref 150–400)
Platelets: 204 10*3/uL (ref 150–400)
Platelets: 219 10*3/uL (ref 150–400)
RBC: 2.69 MIL/uL — ABNORMAL LOW (ref 3.87–5.11)
RBC: 2.72 MIL/uL — ABNORMAL LOW (ref 3.87–5.11)
RBC: 2.76 MIL/uL — ABNORMAL LOW (ref 3.87–5.11)
RBC: 2.85 MIL/uL — ABNORMAL LOW (ref 3.87–5.11)
RBC: 2.89 MIL/uL — ABNORMAL LOW (ref 3.87–5.11)
RBC: 2.99 MIL/uL — ABNORMAL LOW (ref 3.87–5.11)
RDW: 19.5 % — ABNORMAL HIGH (ref 11.5–15.5)
RDW: 19.5 % — ABNORMAL HIGH (ref 11.5–15.5)
RDW: 19.9 % — ABNORMAL HIGH (ref 11.5–15.5)
RDW: 19.9 % — ABNORMAL HIGH (ref 11.5–15.5)
RDW: 20.1 % — ABNORMAL HIGH (ref 11.5–15.5)
RDW: 20.1 % — ABNORMAL HIGH (ref 11.5–15.5)
RDW: 20.3 % — ABNORMAL HIGH (ref 11.5–15.5)
RDW: 20.7 % — ABNORMAL HIGH (ref 11.5–15.5)
RDW: 20.9 % — ABNORMAL HIGH (ref 11.5–15.5)
RDW: 21.1 % — ABNORMAL HIGH (ref 11.5–15.5)
RDW: 21.2 % — ABNORMAL HIGH (ref 11.5–15.5)
WBC: 10.1 10*3/uL (ref 4.0–10.5)
WBC: 10.5 10*3/uL (ref 4.0–10.5)
WBC: 13.9 10*3/uL — ABNORMAL HIGH (ref 4.0–10.5)
WBC: 7.5 10*3/uL (ref 4.0–10.5)
WBC: 7.8 10*3/uL (ref 4.0–10.5)
WBC: 7.9 10*3/uL (ref 4.0–10.5)
WBC: 8.4 10*3/uL (ref 4.0–10.5)
WBC: 9.1 10*3/uL (ref 4.0–10.5)
WBC: 9.4 10*3/uL (ref 4.0–10.5)

## 2010-05-02 LAB — BASIC METABOLIC PANEL
BUN: 42 mg/dL — ABNORMAL HIGH (ref 6–23)
BUN: 49 mg/dL — ABNORMAL HIGH (ref 6–23)
BUN: 55 mg/dL — ABNORMAL HIGH (ref 6–23)
BUN: 62 mg/dL — ABNORMAL HIGH (ref 6–23)
BUN: 62 mg/dL — ABNORMAL HIGH (ref 6–23)
BUN: 63 mg/dL — ABNORMAL HIGH (ref 6–23)
BUN: 75 mg/dL — ABNORMAL HIGH (ref 6–23)
CO2: 34 mEq/L — ABNORMAL HIGH (ref 19–32)
CO2: 35 mEq/L — ABNORMAL HIGH (ref 19–32)
CO2: 35 mEq/L — ABNORMAL HIGH (ref 19–32)
CO2: 37 mEq/L — ABNORMAL HIGH (ref 19–32)
CO2: 38 mEq/L — ABNORMAL HIGH (ref 19–32)
CO2: 39 mEq/L — ABNORMAL HIGH (ref 19–32)
Calcium: 8.5 mg/dL (ref 8.4–10.5)
Calcium: 8.8 mg/dL (ref 8.4–10.5)
Calcium: 9.1 mg/dL (ref 8.4–10.5)
Calcium: 9.1 mg/dL (ref 8.4–10.5)
Calcium: 9.2 mg/dL (ref 8.4–10.5)
Calcium: 9.3 mg/dL (ref 8.4–10.5)
Calcium: 9.4 mg/dL (ref 8.4–10.5)
Chloride: 91 mEq/L — ABNORMAL LOW (ref 96–112)
Chloride: 92 mEq/L — ABNORMAL LOW (ref 96–112)
Chloride: 94 mEq/L — ABNORMAL LOW (ref 96–112)
Chloride: 97 mEq/L (ref 96–112)
Chloride: 99 mEq/L (ref 96–112)
Creatinine, Ser: 2.1 mg/dL — ABNORMAL HIGH (ref 0.4–1.2)
Creatinine, Ser: 2.1 mg/dL — ABNORMAL HIGH (ref 0.4–1.2)
Creatinine, Ser: 2.12 mg/dL — ABNORMAL HIGH (ref 0.4–1.2)
Creatinine, Ser: 2.14 mg/dL — ABNORMAL HIGH (ref 0.4–1.2)
Creatinine, Ser: 2.18 mg/dL — ABNORMAL HIGH (ref 0.4–1.2)
Creatinine, Ser: 2.29 mg/dL — ABNORMAL HIGH (ref 0.4–1.2)
Creatinine, Ser: 2.47 mg/dL — ABNORMAL HIGH (ref 0.4–1.2)
Creatinine, Ser: 2.54 mg/dL — ABNORMAL HIGH (ref 0.4–1.2)
GFR calc Af Amer: 22 mL/min — ABNORMAL LOW (ref >60)
GFR calc Af Amer: 25 mL/min — ABNORMAL LOW (ref >60)
GFR calc Af Amer: 27 mL/min — ABNORMAL LOW (ref >60)
GFR calc Af Amer: 28 mL/min — ABNORMAL LOW (ref >60)
GFR calc non Af Amer: 17 mL/min — ABNORMAL LOW (ref >60)
GFR calc non Af Amer: 19 mL/min — ABNORMAL LOW (ref >60)
GFR calc non Af Amer: 25 mL/min — ABNORMAL LOW (ref >60)
Glucose, Bld: 107 mg/dL — ABNORMAL HIGH (ref 70–99)
Glucose, Bld: 134 mg/dL — ABNORMAL HIGH (ref 70–99)
Glucose, Bld: 169 mg/dL — ABNORMAL HIGH (ref 70–99)
Glucose, Bld: 187 mg/dL — ABNORMAL HIGH (ref 70–99)
Glucose, Bld: 189 mg/dL — ABNORMAL HIGH (ref 70–99)
Glucose, Bld: 208 mg/dL — ABNORMAL HIGH (ref 70–99)
Glucose, Bld: 282 mg/dL — ABNORMAL HIGH (ref 70–99)
Potassium: 4.2 mEq/L (ref 3.5–5.1)
Potassium: 4.3 mEq/L (ref 3.5–5.1)
Sodium: 138 mEq/L (ref 135–145)
Sodium: 139 mEq/L (ref 135–145)

## 2010-05-02 LAB — HERPES SIMPLEX VIRUS(HSV) DNA BY PCR: HSV 2 DNA: NOT DETECTED

## 2010-05-02 LAB — MISCELLANEOUS TEST

## 2010-05-02 LAB — HEMOGLOBIN A1C: Mean Plasma Glucose: 137 mg/dL — ABNORMAL HIGH (ref <117)

## 2010-05-02 LAB — DIFFERENTIAL
Basophils Absolute: 0 10*3/uL (ref 0.0–0.1)
Basophils Absolute: 0 10*3/uL (ref 0.0–0.1)
Basophils Absolute: 0.1 10*3/uL (ref 0.0–0.1)
Basophils Relative: 1 % (ref 0–1)
Basophils Relative: 1 % (ref 0–1)
Eosinophils Absolute: 0 10*3/uL (ref 0.0–0.7)
Eosinophils Absolute: 0 10*3/uL (ref 0.0–0.7)
Eosinophils Relative: 0 % (ref 0–5)
Eosinophils Relative: 1 % (ref 0–5)
Lymphocytes Relative: 18 % (ref 12–46)
Lymphocytes Relative: 25 % (ref 12–46)
Lymphs Abs: 2.4 10*3/uL (ref 0.7–4.0)
Monocytes Absolute: 0.2 10*3/uL (ref 0.1–1.0)
Monocytes Relative: 3 % (ref 3–12)
Neutro Abs: 6.1 10*3/uL (ref 1.7–7.7)
Neutrophils Relative %: 64 % (ref 43–77)
Neutrophils Relative %: 73 % (ref 43–77)
Neutrophils Relative %: 91 % — ABNORMAL HIGH (ref 43–77)

## 2010-05-02 LAB — HEMOCCULT GUIAC POC 1CARD (OFFICE): Fecal Occult Bld: POSITIVE

## 2010-05-02 LAB — BRAIN NATRIURETIC PEPTIDE
Pro B Natriuretic peptide (BNP): 1326 pg/mL — ABNORMAL HIGH (ref 0.0–100.0)
Pro B Natriuretic peptide (BNP): 1662 pg/mL — ABNORMAL HIGH (ref 0.0–100.0)
Pro B Natriuretic peptide (BNP): 1990 pg/mL — ABNORMAL HIGH (ref 0.0–100.0)
Pro B Natriuretic peptide (BNP): 559 pg/mL — ABNORMAL HIGH (ref 0.0–100.0)
Pro B Natriuretic peptide (BNP): 802 pg/mL — ABNORMAL HIGH (ref 0.0–100.0)
Pro B Natriuretic peptide (BNP): 868 pg/mL — ABNORMAL HIGH (ref 0.0–100.0)
Pro B Natriuretic peptide (BNP): 962 pg/mL — ABNORMAL HIGH (ref 0.0–100.0)

## 2010-05-02 LAB — CARDIAC PANEL(CRET KIN+CKTOT+MB+TROPI)
CK, MB: 0.7 ng/mL (ref 0.3–4.0)
CK, MB: 1 ng/mL (ref 0.3–4.0)
CK, MB: 1.1 ng/mL (ref 0.3–4.0)
CK, MB: 1.8 ng/mL (ref 0.3–4.0)
CK, MB: 2.6 ng/mL (ref 0.3–4.0)
Relative Index: INVALID (ref 0.0–2.5)
Relative Index: INVALID (ref 0.0–2.5)
Relative Index: INVALID (ref 0.0–2.5)
Relative Index: INVALID (ref 0.0–2.5)
Relative Index: INVALID (ref 0.0–2.5)
Relative Index: INVALID (ref 0.0–2.5)
Total CK: 11 U/L (ref 7–177)
Total CK: 11 U/L (ref 7–177)
Total CK: 12 U/L (ref 7–177)
Total CK: 13 U/L (ref 7–177)
Troponin I: 0.02 ng/mL (ref 0.00–0.06)
Troponin I: 0.03 ng/mL (ref 0.00–0.06)
Troponin I: 0.05 ng/mL (ref 0.00–0.06)

## 2010-05-02 LAB — CROSSMATCH

## 2010-05-02 LAB — LIPID PANEL
HDL: 88 mg/dL (ref >39)
VLDL: 9 mg/dL (ref 0–40)

## 2010-05-02 LAB — COMPREHENSIVE METABOLIC PANEL
ALT: 17 U/L (ref 0–35)
AST: 18 U/L (ref 0–37)
Albumin: 2.3 g/dL — ABNORMAL LOW (ref 3.5–5.2)
Alkaline Phosphatase: 98 U/L (ref 39–117)
BUN: 72 mg/dL — ABNORMAL HIGH (ref 6–23)
CO2: 35 mEq/L — ABNORMAL HIGH (ref 19–32)
Calcium: 8.8 mg/dL (ref 8.4–10.5)
Chloride: 93 mEq/L — ABNORMAL LOW (ref 96–112)
Creatinine, Ser: 2.75 mg/dL — ABNORMAL HIGH (ref 0.4–1.2)
GFR calc non Af Amer: 18 mL/min — ABNORMAL LOW (ref >60)
Glucose, Bld: 260 mg/dL — ABNORMAL HIGH (ref 70–99)
Potassium: 4.7 mEq/L (ref 3.5–5.1)
Sodium: 136 mEq/L (ref 135–145)
Total Bilirubin: 0.5 mg/dL (ref 0.3–1.2)
Total Protein: 5.6 g/dL — ABNORMAL LOW (ref 6.0–8.3)
Total Protein: 6.9 g/dL (ref 6.0–8.3)

## 2010-05-02 LAB — PROTIME-INR
INR: 1.58 — ABNORMAL HIGH (ref 0.00–1.49)
INR: 1.98 — ABNORMAL HIGH (ref 0.00–1.49)
INR: 2.18 — ABNORMAL HIGH (ref 0.00–1.49)
INR: 2.38 — ABNORMAL HIGH (ref 0.00–1.49)
INR: 2.58 — ABNORMAL HIGH (ref 0.00–1.49)
INR: 2.73 — ABNORMAL HIGH (ref 0.00–1.49)
Prothrombin Time: 21.8 seconds — ABNORMAL HIGH (ref 11.6–15.2)
Prothrombin Time: 24.1 seconds — ABNORMAL HIGH (ref 11.6–15.2)
Prothrombin Time: 28.4 seconds — ABNORMAL HIGH (ref 11.6–15.2)
Prothrombin Time: 28.7 seconds — ABNORMAL HIGH (ref 11.6–15.2)

## 2010-05-02 LAB — POCT I-STAT 3, VENOUS BLOOD GAS (G3P V)
Bicarbonate: 40.8 mEq/L — ABNORMAL HIGH (ref 20.0–24.0)
Patient temperature: 97.4
TCO2: 43 mmol/L (ref 0–100)
pCO2, Ven: 87 mmHg (ref 45.0–50.0)
pH, Ven: 7.276 (ref 7.250–7.300)
pO2, Ven: 36 mmHg (ref 30.0–45.0)

## 2010-05-02 LAB — HEPARIN LEVEL (UNFRACTIONATED)
Heparin Unfractionated: 0.33 IU/mL (ref 0.30–0.70)
Heparin Unfractionated: 0.33 IU/mL (ref 0.30–0.70)

## 2010-05-02 LAB — CULTURE, BLOOD (ROUTINE X 2)

## 2010-05-02 LAB — MAGNESIUM
Magnesium: 1.7 mg/dL (ref 1.5–2.5)
Magnesium: 1.8 mg/dL (ref 1.5–2.5)
Magnesium: 1.8 mg/dL (ref 1.5–2.5)
Magnesium: 2.1 mg/dL (ref 1.5–2.5)
Magnesium: 2.8 mg/dL — ABNORMAL HIGH (ref 1.5–2.5)

## 2010-05-02 LAB — URINALYSIS, ROUTINE W REFLEX MICROSCOPIC
Bilirubin Urine: NEGATIVE
Nitrite: POSITIVE — AB
Specific Gravity, Urine: 1.014 (ref 1.005–1.030)
pH: 5 (ref 5.0–8.0)

## 2010-05-02 LAB — CORTISOL: Cortisol, Plasma: 51.5 ug/dL

## 2010-05-02 LAB — SODIUM, URINE, RANDOM: Sodium, Ur: <10 mEq/L

## 2010-05-02 LAB — PREPARE FRESH FROZEN PLASMA

## 2010-05-02 LAB — CULTURE, RESPIRATORY W GRAM STAIN

## 2010-05-02 LAB — URINE MICROSCOPIC-ADD ON

## 2010-05-02 LAB — CULTURE, BAL-QUANTITATIVE W GRAM STAIN: Colony Count: 600

## 2010-05-02 LAB — LEGIONELLA ANTIGEN, URINE: Legionella Antigen, Urine: NEGATIVE

## 2010-05-02 LAB — PROCALCITONIN: Procalcitonin: <0.5 ng/mL

## 2010-05-02 LAB — LIPASE, BLOOD: Lipase: 30 U/L (ref 11–59)

## 2010-05-02 LAB — APTT: aPTT: 35 seconds (ref 24–37)

## 2010-05-02 LAB — STREP PNEUMONIAE URINARY ANTIGEN: Strep Pneumo Urinary Antigen: NEGATIVE

## 2010-05-02 LAB — URINE CULTURE

## 2010-05-02 LAB — ACETYLCHOLINE RECEPTOR, BINDING: Acetylcholine Receptor Ab: <0.3 nmol/L (ref <=0.30)

## 2010-05-02 LAB — URIC ACID: Uric Acid, Serum: 5.2 mg/dL (ref 2.4–7.0)

## 2010-05-02 NOTE — Discharge Summary (Signed)
  NAMEDICIE, [name] NO.:  [id number]  MEDICAL RECORD NO.:  [id number]           PATIENT TYPE:  I  LOCATION:  4525                         FACILITY:  [facility]  PHYSICIAN:  Charlcie Cradle. Delford Field, MD, FCCPDATE OF BIRTH:  Jul 16, 1935  DATE OF ADMISSION:  03/04/2010 DATE OF DISCHARGE:  2010-03-25                              DISCHARGE SUMMARY   DEATH SUMMARY  DATE OF DEATH:  March 25, 2010.  DEATH DIAGNOSES: 1. Acute on chronic respiratory failure. 2. End-stage renal disease. 3. Pneumonia. 4. Clostridium difficile colitis with enteritis. 5. Hyperglycemia.  HISTORY OF PRESENT ILLNESS:  See history and physical on chart.  HOSPITAL COURSE:  This 75 year old white female was admitted largely to the [facility] for the bulk of her hospitalization.  They did try a retransfer of this patient to Critical Care on March 11, 2010, because of recurrent respiratory failure.  Critical Care took over the last 2 or 3 days of this patient's hospitalization.  She has chronic respiratory failure, end-stage renal disease on dialysis, atrial fibrillation, and chronic Coumadin.  She has previous bilateral hip debridement, history of hypertension, diabetes, sick sinus syndrome, and previous pacemaker placement.  Admitted initially on February 12 because of severe dyspnea.  This patient is now progressively worse.  I am seeing this patient in the last several days of this patient's hospitalization and was able to convince the family and the patient to a no code blue status after repeated efforts to resuscitate this patient. This was confirmed on 24-Mar-2010, and the patient died shortly thereafter on Mar 25, 2010.  There will be no autopsy.     Charlcie Cradle Delford Field, MD, [facility]     PEW/MEDQ  D:  04/30/2010  T:  05/01/2010  Job:  [id number]  Electronically Signed by Shan Levans MD FCCP on 05/02/2010 09:15:26 PM

## 2010-05-03 LAB — DIFFERENTIAL
Basophils Absolute: 0 10*3/uL (ref 0.0–0.1)
Eosinophils Relative: 3 % (ref 0–5)
Lymphocytes Relative: 33 % (ref 12–46)
Lymphs Abs: 2.9 10*3/uL (ref 0.7–4.0)
Neutrophils Relative %: 56 % (ref 43–77)

## 2010-05-03 LAB — CBC
HCT: 32.3 % — ABNORMAL LOW (ref 36.0–46.0)
Hemoglobin: 10.4 g/dL — ABNORMAL LOW (ref 12.0–15.0)
MCHC: 32.3 g/dL (ref 30.0–36.0)
MCV: 93 fL (ref 78.0–100.0)
RBC: 3.47 MIL/uL — ABNORMAL LOW (ref 3.87–5.11)
WBC: 8.8 10*3/uL (ref 4.0–10.5)

## 2010-05-03 LAB — COMPREHENSIVE METABOLIC PANEL
BUN: 32 mg/dL — ABNORMAL HIGH (ref 6–23)
CO2: 38 mEq/L — ABNORMAL HIGH (ref 19–32)
Calcium: 9.8 mg/dL (ref 8.4–10.5)
Chloride: 97 mEq/L (ref 96–112)
Creatinine, Ser: 2.23 mg/dL — ABNORMAL HIGH (ref 0.4–1.2)
GFR calc Af Amer: 26 mL/min — ABNORMAL LOW (ref >60)
GFR calc non Af Amer: 22 mL/min — ABNORMAL LOW (ref >60)
Glucose, Bld: 81 mg/dL (ref 70–99)
Total Bilirubin: 0.2 mg/dL — ABNORMAL LOW (ref 0.3–1.2)

## 2010-05-03 LAB — URINALYSIS, ROUTINE W REFLEX MICROSCOPIC
Bilirubin Urine: NEGATIVE
Glucose, UA: NEGATIVE mg/dL
Ketones, ur: NEGATIVE mg/dL
Leukocytes, UA: NEGATIVE
Nitrite: POSITIVE — AB
Protein, ur: NEGATIVE mg/dL
pH: 5.5 (ref 5.0–8.0)

## 2010-05-03 LAB — PROTIME-INR: Prothrombin Time: 38.9 seconds — ABNORMAL HIGH (ref 11.6–15.2)

## 2010-05-03 LAB — BRAIN NATRIURETIC PEPTIDE: Pro B Natriuretic peptide (BNP): 292 pg/mL — ABNORMAL HIGH (ref 0.0–100.0)

## 2010-05-03 LAB — POTASSIUM: Potassium: 4.6 mEq/L (ref 3.5–5.1)

## 2010-05-03 LAB — GLUCOSE, CAPILLARY: Glucose-Capillary: 69 mg/dL — ABNORMAL LOW (ref 70–99)

## 2010-05-03 LAB — URINE MICROSCOPIC-ADD ON

## 2010-05-03 LAB — D-DIMER, QUANTITATIVE: D-Dimer, Quant: 0.34 ug/mL-FEU (ref 0.00–0.48)

## 2010-05-04 LAB — POCT I-STAT 3, ART BLOOD GAS (G3+)
Acid-Base Excess: 1 mmol/L (ref 0.0–2.0)
Acid-Base Excess: 2 mmol/L (ref 0.0–2.0)
Bicarbonate: 25.6 mEq/L — ABNORMAL HIGH (ref 20.0–24.0)
Bicarbonate: 26.5 mEq/L — ABNORMAL HIGH (ref 20.0–24.0)
Bicarbonate: 27.2 mEq/L — ABNORMAL HIGH (ref 20.0–24.0)
Bicarbonate: 27.2 mEq/L — ABNORMAL HIGH (ref 20.0–24.0)
Bicarbonate: 27.3 mEq/L — ABNORMAL HIGH (ref 20.0–24.0)
O2 Saturation: 95 %
O2 Saturation: 97 %
O2 Saturation: 98 %
O2 Saturation: 98 %
Patient temperature: 97.5
Patient temperature: 98
TCO2: 27 mmol/L (ref 0–100)
TCO2: 28 mmol/L (ref 0–100)
TCO2: 29 mmol/L (ref 0–100)
TCO2: 29 mmol/L (ref 0–100)
TCO2: 29 mmol/L (ref 0–100)
pCO2 arterial: 46.6 mmHg — ABNORMAL HIGH (ref 35.0–45.0)
pCO2 arterial: 55 mmHg — ABNORMAL HIGH (ref 35.0–45.0)
pH, Arterial: 7.346 — ABNORMAL LOW (ref 7.350–7.400)
pH, Arterial: 7.409 — ABNORMAL HIGH (ref 7.350–7.400)
pO2, Arterial: 105 mmHg — ABNORMAL HIGH (ref 80.0–100.0)
pO2, Arterial: 109 mmHg — ABNORMAL HIGH (ref 80.0–100.0)
pO2, Arterial: 78 mmHg — ABNORMAL LOW (ref 80.0–100.0)
pO2, Arterial: 83 mmHg (ref 80.0–100.0)

## 2010-05-04 LAB — DIFFERENTIAL
Basophils Absolute: 0 10*3/uL (ref 0.0–0.1)
Basophils Absolute: 0 10*3/uL (ref 0.0–0.1)
Basophils Relative: 1 % (ref 0–1)
Basophils Relative: 0 % (ref 0–1)
Basophils Relative: 0 % (ref 0–1)
Eosinophils Absolute: 0.2 10*3/uL (ref 0.0–0.7)
Eosinophils Absolute: 0.3 10*3/uL (ref 0.0–0.7)
Eosinophils Absolute: 0.3 10*3/uL (ref 0.0–0.7)
Eosinophils Absolute: 0.5 10*3/uL (ref 0.0–0.7)
Eosinophils Relative: 4 % (ref 0–5)
Eosinophils Relative: 4 % (ref 0–5)
Eosinophils Relative: 4 % (ref 0–5)
Lymphocytes Relative: 40 % (ref 12–46)
Lymphocytes Relative: 16 % (ref 12–46)
Lymphs Abs: 2.4 10*3/uL (ref 0.7–4.0)
Lymphs Abs: 1.5 10*3/uL (ref 0.7–4.0)
Lymphs Abs: 1.8 10*3/uL (ref 0.7–4.0)
Monocytes Absolute: 0.7 10*3/uL (ref 0.1–1.0)
Monocytes Absolute: 0.6 10*3/uL (ref 0.1–1.0)
Monocytes Absolute: 0.7 10*3/uL (ref 0.1–1.0)
Monocytes Relative: 11 % (ref 3–12)
Monocytes Relative: 10 % (ref 3–12)
Monocytes Relative: 5 % (ref 3–12)
Monocytes Relative: 7 % (ref 3–12)
Neutro Abs: 4.5 10*3/uL (ref 1.7–7.7)
Neutro Abs: 5 10*3/uL (ref 1.7–7.7)
Neutrophils Relative %: 46 % (ref 43–77)
Neutrophils Relative %: 64 % (ref 43–77)
Neutrophils Relative %: 68 % (ref 43–77)

## 2010-05-04 LAB — BASIC METABOLIC PANEL
BUN: 18 mg/dL (ref 6–23)
BUN: 19 mg/dL (ref 6–23)
BUN: 25 mg/dL — ABNORMAL HIGH (ref 6–23)
BUN: 35 mg/dL — ABNORMAL HIGH (ref 6–23)
CO2: 28 mEq/L (ref 19–32)
CO2: 30 mEq/L (ref 19–32)
CO2: 31 mEq/L (ref 19–32)
CO2: 32 mEq/L (ref 19–32)
CO2: 32 mEq/L (ref 19–32)
CO2: 34 mEq/L — ABNORMAL HIGH (ref 19–32)
Calcium: 9 mg/dL (ref 8.4–10.5)
Calcium: 9.2 mg/dL (ref 8.4–10.5)
Calcium: 9.2 mg/dL (ref 8.4–10.5)
Calcium: 9.3 mg/dL (ref 8.4–10.5)
Chloride: 94 mEq/L — ABNORMAL LOW (ref 96–112)
Chloride: 110 mEq/L (ref 96–112)
Chloride: 95 mEq/L — ABNORMAL LOW (ref 96–112)
Chloride: 96 mEq/L (ref 96–112)
Chloride: 97 mEq/L (ref 96–112)
Chloride: 98 mEq/L (ref 96–112)
Chloride: 98 mEq/L (ref 96–112)
Chloride: 99 mEq/L (ref 96–112)
Creatinine, Ser: 1.58 mg/dL — ABNORMAL HIGH (ref 0.4–1.2)
Creatinine, Ser: 1.85 mg/dL — ABNORMAL HIGH (ref 0.4–1.2)
Creatinine, Ser: 1.96 mg/dL — ABNORMAL HIGH (ref 0.4–1.2)
Creatinine, Ser: 2.02 mg/dL — ABNORMAL HIGH (ref 0.4–1.2)
Creatinine, Ser: 2.16 mg/dL — ABNORMAL HIGH (ref 0.4–1.2)
Creatinine, Ser: 2.18 mg/dL — ABNORMAL HIGH (ref 0.4–1.2)
Creatinine, Ser: 2.21 mg/dL — ABNORMAL HIGH (ref 0.4–1.2)
GFR calc Af Amer: 26 mL/min — ABNORMAL LOW (ref >60)
GFR calc Af Amer: 27 mL/min — ABNORMAL LOW (ref >60)
GFR calc Af Amer: 29 mL/min — ABNORMAL LOW (ref >60)
GFR calc Af Amer: 32 mL/min — ABNORMAL LOW (ref >60)
GFR calc Af Amer: 35 mL/min — ABNORMAL LOW (ref >60)
GFR calc Af Amer: 39 mL/min — ABNORMAL LOW (ref >60)
GFR calc non Af Amer: 20 mL/min — ABNORMAL LOW (ref >60)
GFR calc non Af Amer: 22 mL/min — ABNORMAL LOW (ref >60)
GFR calc non Af Amer: 25 mL/min — ABNORMAL LOW (ref >60)
GFR calc non Af Amer: 32 mL/min — ABNORMAL LOW (ref >60)
GFR calc non Af Amer: 35 mL/min — ABNORMAL LOW (ref >60)
Glucose, Bld: 136 mg/dL — ABNORMAL HIGH (ref 70–99)
Glucose, Bld: 140 mg/dL — ABNORMAL HIGH (ref 70–99)
Glucose, Bld: 171 mg/dL — ABNORMAL HIGH (ref 70–99)
Potassium: 3.8 mEq/L (ref 3.5–5.1)
Potassium: 3.6 mEq/L (ref 3.5–5.1)
Potassium: 5 mEq/L (ref 3.5–5.1)
Sodium: 134 mEq/L — ABNORMAL LOW (ref 135–145)
Sodium: 135 mEq/L (ref 135–145)
Sodium: 136 mEq/L (ref 135–145)
Sodium: 137 mEq/L (ref 135–145)
Sodium: 146 mEq/L — ABNORMAL HIGH (ref 135–145)

## 2010-05-04 LAB — RENAL FUNCTION PANEL
Albumin: 1.8 g/dL — ABNORMAL LOW (ref 3.5–5.2)
Albumin: 1.8 g/dL — ABNORMAL LOW (ref 3.5–5.2)
BUN: 30 mg/dL — ABNORMAL HIGH (ref 6–23)
BUN: 43 mg/dL — ABNORMAL HIGH (ref 6–23)
CO2: 25 mEq/L (ref 19–32)
CO2: 26 mEq/L (ref 19–32)
Calcium: 8.8 mg/dL (ref 8.4–10.5)
Chloride: 107 mEq/L (ref 96–112)
Chloride: 110 mEq/L (ref 96–112)
Creatinine, Ser: 1.7 mg/dL — ABNORMAL HIGH (ref 0.4–1.2)
Creatinine, Ser: 1.98 mg/dL — ABNORMAL HIGH (ref 0.4–1.2)
GFR calc Af Amer: 30 mL/min — ABNORMAL LOW (ref >60)
GFR calc Af Amer: 36 mL/min — ABNORMAL LOW (ref >60)
GFR calc non Af Amer: 30 mL/min — ABNORMAL LOW (ref >60)
Potassium: 3.7 mEq/L (ref 3.5–5.1)

## 2010-05-04 LAB — CBC
HCT: 20.5 % — ABNORMAL LOW (ref 36.0–46.0)
HCT: 24.5 % — ABNORMAL LOW (ref 36.0–46.0)
HCT: 27 % — ABNORMAL LOW (ref 36.0–46.0)
HCT: 28.5 % — ABNORMAL LOW (ref 36.0–46.0)
HCT: 28.8 % — ABNORMAL LOW (ref 36.0–46.0)
HCT: 29.4 % — ABNORMAL LOW (ref 36.0–46.0)
HCT: 30.3 % — ABNORMAL LOW (ref 36.0–46.0)
Hemoglobin: 9.4 g/dL — ABNORMAL LOW (ref 12.0–15.0)
Hemoglobin: 10 g/dL — ABNORMAL LOW (ref 12.0–15.0)
Hemoglobin: 8 g/dL — ABNORMAL LOW (ref 12.0–15.0)
Hemoglobin: 8.6 g/dL — ABNORMAL LOW (ref 12.0–15.0)
Hemoglobin: 8.7 g/dL — ABNORMAL LOW (ref 12.0–15.0)
Hemoglobin: 8.8 g/dL — ABNORMAL LOW (ref 12.0–15.0)
Hemoglobin: 9 g/dL — ABNORMAL LOW (ref 12.0–15.0)
Hemoglobin: 9 g/dL — ABNORMAL LOW (ref 12.0–15.0)
Hemoglobin: 9.3 g/dL — ABNORMAL LOW (ref 12.0–15.0)
Hemoglobin: 9.3 g/dL — ABNORMAL LOW (ref 12.0–15.0)
MCHC: 32.6 g/dL (ref 30.0–36.0)
MCHC: 32.8 g/dL (ref 30.0–36.0)
MCHC: 31.7 g/dL (ref 30.0–36.0)
MCHC: 32.1 g/dL (ref 30.0–36.0)
MCHC: 32.2 g/dL (ref 30.0–36.0)
MCHC: 32.4 g/dL (ref 30.0–36.0)
MCHC: 32.5 g/dL (ref 30.0–36.0)
MCHC: 32.5 g/dL (ref 30.0–36.0)
MCHC: 32.5 g/dL (ref 30.0–36.0)
MCHC: 32.5 g/dL (ref 30.0–36.0)
MCHC: 32.5 g/dL (ref 30.0–36.0)
MCHC: 32.6 g/dL (ref 30.0–36.0)
MCHC: 32.6 g/dL (ref 30.0–36.0)
MCHC: 32.8 g/dL (ref 30.0–36.0)
MCHC: 34.1 g/dL (ref 30.0–36.0)
MCV: 89.8 fL (ref 78.0–100.0)
MCV: 90 fL (ref 78.0–100.0)
MCV: 90.1 fL (ref 78.0–100.0)
MCV: 90.9 fL (ref 78.0–100.0)
MCV: 91 fL (ref 78.0–100.0)
MCV: 91.1 fL (ref 78.0–100.0)
MCV: 91.3 fL (ref 78.0–100.0)
MCV: 91.8 fL (ref 78.0–100.0)
MCV: 92 fL (ref 78.0–100.0)
MCV: 92.7 fL (ref 78.0–100.0)
Platelets: 219 10*3/uL (ref 150–400)
Platelets: 223 10*3/uL (ref 150–400)
Platelets: 108 10*3/uL — ABNORMAL LOW (ref 150–400)
Platelets: 195 10*3/uL (ref 150–400)
Platelets: 254 10*3/uL (ref 150–400)
Platelets: 270 10*3/uL (ref 150–400)
Platelets: 30 10*3/uL — ABNORMAL LOW (ref 150–400)
Platelets: 31 10*3/uL — ABNORMAL LOW (ref 150–400)
RBC: 3.2 MIL/uL — ABNORMAL LOW (ref 3.87–5.11)
RBC: 3.22 MIL/uL — ABNORMAL LOW (ref 3.87–5.11)
RBC: 2.65 MIL/uL — ABNORMAL LOW (ref 3.87–5.11)
RBC: 2.67 MIL/uL — ABNORMAL LOW (ref 3.87–5.11)
RBC: 2.92 MIL/uL — ABNORMAL LOW (ref 3.87–5.11)
RBC: 2.93 MIL/uL — ABNORMAL LOW (ref 3.87–5.11)
RBC: 2.99 MIL/uL — ABNORMAL LOW (ref 3.87–5.11)
RBC: 3.01 MIL/uL — ABNORMAL LOW (ref 3.87–5.11)
RBC: 3.02 MIL/uL — ABNORMAL LOW (ref 3.87–5.11)
RBC: 3.13 MIL/uL — ABNORMAL LOW (ref 3.87–5.11)
RBC: 3.2 MIL/uL — ABNORMAL LOW (ref 3.87–5.11)
RBC: 3.3 MIL/uL — ABNORMAL LOW (ref 3.87–5.11)
RBC: 3.32 MIL/uL — ABNORMAL LOW (ref 3.87–5.11)
RDW: 18.6 % — ABNORMAL HIGH (ref 11.5–15.5)
RDW: 19 % — ABNORMAL HIGH (ref 11.5–15.5)
RDW: 17.7 % — ABNORMAL HIGH (ref 11.5–15.5)
RDW: 18.1 % — ABNORMAL HIGH (ref 11.5–15.5)
RDW: 18.5 % — ABNORMAL HIGH (ref 11.5–15.5)
RDW: 18.8 % — ABNORMAL HIGH (ref 11.5–15.5)
RDW: 19.7 % — ABNORMAL HIGH (ref 11.5–15.5)
RDW: 19.8 % — ABNORMAL HIGH (ref 11.5–15.5)
WBC: 5.6 10*3/uL (ref 4.0–10.5)
WBC: 7.1 10*3/uL (ref 4.0–10.5)
WBC: 10.6 10*3/uL — ABNORMAL HIGH (ref 4.0–10.5)
WBC: 11.3 10*3/uL — ABNORMAL HIGH (ref 4.0–10.5)
WBC: 4.7 10*3/uL (ref 4.0–10.5)
WBC: 4.8 10*3/uL (ref 4.0–10.5)
WBC: 4.9 10*3/uL (ref 4.0–10.5)
WBC: 6.9 10*3/uL (ref 4.0–10.5)
WBC: 7.4 10*3/uL (ref 4.0–10.5)
WBC: 7.5 10*3/uL (ref 4.0–10.5)

## 2010-05-04 LAB — GLUCOSE, CAPILLARY
Glucose-Capillary: 142 mg/dL — ABNORMAL HIGH (ref 70–99)
Glucose-Capillary: 143 mg/dL — ABNORMAL HIGH (ref 70–99)
Glucose-Capillary: 155 mg/dL — ABNORMAL HIGH (ref 70–99)
Glucose-Capillary: 174 mg/dL — ABNORMAL HIGH (ref 70–99)
Glucose-Capillary: 97 mg/dL (ref 70–99)
Glucose-Capillary: 103 mg/dL — ABNORMAL HIGH (ref 70–99)
Glucose-Capillary: 112 mg/dL — ABNORMAL HIGH (ref 70–99)
Glucose-Capillary: 115 mg/dL — ABNORMAL HIGH (ref 70–99)
Glucose-Capillary: 116 mg/dL — ABNORMAL HIGH (ref 70–99)
Glucose-Capillary: 125 mg/dL — ABNORMAL HIGH (ref 70–99)
Glucose-Capillary: 127 mg/dL — ABNORMAL HIGH (ref 70–99)
Glucose-Capillary: 131 mg/dL — ABNORMAL HIGH (ref 70–99)
Glucose-Capillary: 132 mg/dL — ABNORMAL HIGH (ref 70–99)
Glucose-Capillary: 137 mg/dL — ABNORMAL HIGH (ref 70–99)
Glucose-Capillary: 143 mg/dL — ABNORMAL HIGH (ref 70–99)
Glucose-Capillary: 144 mg/dL — ABNORMAL HIGH (ref 70–99)
Glucose-Capillary: 145 mg/dL — ABNORMAL HIGH (ref 70–99)
Glucose-Capillary: 149 mg/dL — ABNORMAL HIGH (ref 70–99)
Glucose-Capillary: 150 mg/dL — ABNORMAL HIGH (ref 70–99)
Glucose-Capillary: 151 mg/dL — ABNORMAL HIGH (ref 70–99)
Glucose-Capillary: 154 mg/dL — ABNORMAL HIGH (ref 70–99)
Glucose-Capillary: 155 mg/dL — ABNORMAL HIGH (ref 70–99)
Glucose-Capillary: 157 mg/dL — ABNORMAL HIGH (ref 70–99)
Glucose-Capillary: 161 mg/dL — ABNORMAL HIGH (ref 70–99)
Glucose-Capillary: 163 mg/dL — ABNORMAL HIGH (ref 70–99)
Glucose-Capillary: 167 mg/dL — ABNORMAL HIGH (ref 70–99)
Glucose-Capillary: 170 mg/dL — ABNORMAL HIGH (ref 70–99)
Glucose-Capillary: 171 mg/dL — ABNORMAL HIGH (ref 70–99)
Glucose-Capillary: 171 mg/dL — ABNORMAL HIGH (ref 70–99)
Glucose-Capillary: 174 mg/dL — ABNORMAL HIGH (ref 70–99)
Glucose-Capillary: 178 mg/dL — ABNORMAL HIGH (ref 70–99)
Glucose-Capillary: 182 mg/dL — ABNORMAL HIGH (ref 70–99)
Glucose-Capillary: 183 mg/dL — ABNORMAL HIGH (ref 70–99)
Glucose-Capillary: 186 mg/dL — ABNORMAL HIGH (ref 70–99)
Glucose-Capillary: 187 mg/dL — ABNORMAL HIGH (ref 70–99)
Glucose-Capillary: 193 mg/dL — ABNORMAL HIGH (ref 70–99)
Glucose-Capillary: 194 mg/dL — ABNORMAL HIGH (ref 70–99)
Glucose-Capillary: 194 mg/dL — ABNORMAL HIGH (ref 70–99)
Glucose-Capillary: 199 mg/dL — ABNORMAL HIGH (ref 70–99)
Glucose-Capillary: 210 mg/dL — ABNORMAL HIGH (ref 70–99)
Glucose-Capillary: 215 mg/dL — ABNORMAL HIGH (ref 70–99)
Glucose-Capillary: 219 mg/dL — ABNORMAL HIGH (ref 70–99)
Glucose-Capillary: 221 mg/dL — ABNORMAL HIGH (ref 70–99)
Glucose-Capillary: 222 mg/dL — ABNORMAL HIGH (ref 70–99)
Glucose-Capillary: 98 mg/dL (ref 70–99)

## 2010-05-04 LAB — MAGNESIUM
Magnesium: 1.4 mg/dL — ABNORMAL LOW (ref 1.5–2.5)
Magnesium: 1 mg/dL — ABNORMAL LOW (ref 1.5–2.5)
Magnesium: 1.3 mg/dL — ABNORMAL LOW (ref 1.5–2.5)
Magnesium: 1.6 mg/dL (ref 1.5–2.5)
Magnesium: 1.7 mg/dL (ref 1.5–2.5)

## 2010-05-04 LAB — PROTIME-INR
INR: 4.43 — ABNORMAL HIGH (ref 0.00–1.49)
INR: 6.21 (ref 0.00–1.49)
INR: 1.57 — ABNORMAL HIGH (ref 0.00–1.49)
INR: 2.38 — ABNORMAL HIGH (ref 0.00–1.49)
INR: 2.79 — ABNORMAL HIGH (ref 0.00–1.49)
INR: 5.96 (ref 0.00–1.49)
Prothrombin Time: 26.9 seconds — ABNORMAL HIGH (ref 11.6–15.2)
Prothrombin Time: 41.9 seconds — ABNORMAL HIGH (ref 11.6–15.2)
Prothrombin Time: 18.5 seconds — ABNORMAL HIGH (ref 11.6–15.2)
Prothrombin Time: 21.2 seconds — ABNORMAL HIGH (ref 11.6–15.2)
Prothrombin Time: 22.7 seconds — ABNORMAL HIGH (ref 11.6–15.2)
Prothrombin Time: 25.8 seconds — ABNORMAL HIGH (ref 11.6–15.2)
Prothrombin Time: 31.1 seconds — ABNORMAL HIGH (ref 11.6–15.2)
Prothrombin Time: 46.2 seconds — ABNORMAL HIGH (ref 11.6–15.2)
Prothrombin Time: 47.6 seconds — ABNORMAL HIGH (ref 11.6–15.2)
Prothrombin Time: 52.9 seconds — ABNORMAL HIGH (ref 11.6–15.2)

## 2010-05-04 LAB — HEPARIN INDUCED THROMBOCYTOPENIA PNL
UFH High Dose UFH H: 0 % Release
UFH Low Dose 0.1 IU/mL: 0 % Release
UFH Low Dose 0.5 IU/mL: 0 % Release
UFH SRA Result: NEGATIVE

## 2010-05-04 LAB — HEMOCCULT GUIAC POC 1CARD (OFFICE): Fecal Occult Bld: NEGATIVE

## 2010-05-04 LAB — COMPREHENSIVE METABOLIC PANEL
ALT: 12 U/L (ref 0–35)
ALT: 15 U/L (ref 0–35)
AST: 15 U/L (ref 0–37)
AST: 18 U/L (ref 0–37)
Albumin: 2.2 g/dL — ABNORMAL LOW (ref 3.5–5.2)
Alkaline Phosphatase: 116 U/L (ref 39–117)
CO2: 31 mEq/L (ref 19–32)
CO2: 34 mEq/L — ABNORMAL HIGH (ref 19–32)
Calcium: 8.9 mg/dL (ref 8.4–10.5)
Calcium: 9.1 mg/dL (ref 8.4–10.5)
Chloride: 99 mEq/L (ref 96–112)
GFR calc Af Amer: 25 mL/min — ABNORMAL LOW (ref >60)
GFR calc Af Amer: 25 mL/min — ABNORMAL LOW (ref >60)
GFR calc non Af Amer: 21 mL/min — ABNORMAL LOW (ref >60)
Glucose, Bld: 113 mg/dL — ABNORMAL HIGH (ref 70–99)
Sodium: 135 mEq/L (ref 135–145)
Sodium: 137 mEq/L (ref 135–145)
Total Bilirubin: 0.5 mg/dL (ref 0.3–1.2)
Total Protein: 5.6 g/dL — ABNORMAL LOW (ref 6.0–8.3)

## 2010-05-04 LAB — LUPUS ANTICOAGULANT PANEL
DRVVT: 112.9 secs — ABNORMAL HIGH (ref 36.2–44.3)
Lupus Anticoagulant: NOT DETECTED
PTTLA 4:1 Mix: 59.6 secs — ABNORMAL HIGH (ref 36.3–48.8)
PTTLA Confirmation: 2.7 secs (ref <8.0)
dRVVT Incubated 1:1 Mix: 42.2 secs (ref 36.2–44.3)

## 2010-05-04 LAB — PROTEIN S ACTIVITY: Protein S Activity: <15 % (ref 69–129)

## 2010-05-04 LAB — PROTHROMBIN GENE MUTATION

## 2010-05-04 LAB — TROPONIN I: Troponin I: 0.07 ng/mL — ABNORMAL HIGH (ref 0.00–0.06)

## 2010-05-04 LAB — PHOSPHORUS
Phosphorus: 2.7 mg/dL (ref 2.3–4.6)
Phosphorus: 3.2 mg/dL (ref 2.3–4.6)

## 2010-05-04 LAB — ANTITHROMBIN III: AntiThromb III Func: 97 % (ref 76–126)

## 2010-05-04 LAB — CARDIOLIPIN ANTIBODIES, IGG, IGM, IGA: Anticardiolipin IgG: 5 GPL U/mL — ABNORMAL LOW (ref <23)

## 2010-05-04 LAB — BETA-2-GLYCOPROTEIN I ABS, IGG/M/A
Beta-2 Glyco I IgG: 3 G Units (ref <20)
Beta-2-Glycoprotein I IgM: 2 M Units (ref <20)

## 2010-05-04 LAB — BRAIN NATRIURETIC PEPTIDE: Pro B Natriuretic peptide (BNP): 708 pg/mL — ABNORMAL HIGH (ref 0.0–100.0)

## 2010-05-05 LAB — CBC
HCT: 25.7 % — ABNORMAL LOW (ref 36.0–46.0)
HCT: 33.6 % — ABNORMAL LOW (ref 36.0–46.0)
HCT: 26 % — ABNORMAL LOW (ref 36.0–46.0)
HCT: 26.3 % — ABNORMAL LOW (ref 36.0–46.0)
HCT: 26.4 % — ABNORMAL LOW (ref 36.0–46.0)
HCT: 26.9 % — ABNORMAL LOW (ref 36.0–46.0)
HCT: 27.9 % — ABNORMAL LOW (ref 36.0–46.0)
HCT: 27.9 % — ABNORMAL LOW (ref 36.0–46.0)
HCT: 28.4 % — ABNORMAL LOW (ref 36.0–46.0)
HCT: 28.9 % — ABNORMAL LOW (ref 36.0–46.0)
Hemoglobin: 10.8 g/dL — ABNORMAL LOW (ref 12.0–15.0)
Hemoglobin: 9.1 g/dL — ABNORMAL LOW (ref 12.0–15.0)
Hemoglobin: 8.5 g/dL — ABNORMAL LOW (ref 12.0–15.0)
Hemoglobin: 8.6 g/dL — ABNORMAL LOW (ref 12.0–15.0)
Hemoglobin: 8.8 g/dL — ABNORMAL LOW (ref 12.0–15.0)
Hemoglobin: 9.2 g/dL — ABNORMAL LOW (ref 12.0–15.0)
Hemoglobin: 9.2 g/dL — ABNORMAL LOW (ref 12.0–15.0)
Hemoglobin: 9.4 g/dL — ABNORMAL LOW (ref 12.0–15.0)
MCHC: 32.8 g/dL (ref 30.0–36.0)
MCHC: 32.7 g/dL (ref 30.0–36.0)
MCHC: 32.8 g/dL (ref 30.0–36.0)
MCHC: 32.9 g/dL (ref 30.0–36.0)
MCHC: 33 g/dL (ref 30.0–36.0)
MCHC: 33.1 g/dL (ref 30.0–36.0)
MCHC: 33.5 g/dL (ref 30.0–36.0)
MCHC: 33.5 g/dL (ref 30.0–36.0)
MCHC: 33.9 g/dL (ref 30.0–36.0)
MCHC: 34.2 g/dL (ref 30.0–36.0)
MCV: 92.7 fL (ref 78.0–100.0)
MCV: 92.4 fL (ref 78.0–100.0)
MCV: 92.8 fL (ref 78.0–100.0)
MCV: 92.8 fL (ref 78.0–100.0)
MCV: 93 fL (ref 78.0–100.0)
MCV: 93.4 fL (ref 78.0–100.0)
MCV: 93.4 fL (ref 78.0–100.0)
MCV: 93.7 fL (ref 78.0–100.0)
MCV: 93.9 fL (ref 78.0–100.0)
MCV: 93.9 fL (ref 78.0–100.0)
MCV: 94.1 fL (ref 78.0–100.0)
MCV: 94.4 fL (ref 78.0–100.0)
Platelets: 34 10*3/uL — ABNORMAL LOW (ref 150–400)
Platelets: 62 10*3/uL — ABNORMAL LOW (ref 150–400)
Platelets: 187 10*3/uL (ref 150–400)
Platelets: 197 10*3/uL (ref 150–400)
Platelets: 295 10*3/uL (ref 150–400)
Platelets: 316 10*3/uL (ref 150–400)
RBC: 3.62 MIL/uL — ABNORMAL LOW (ref 3.87–5.11)
RBC: 2.77 MIL/uL — ABNORMAL LOW (ref 3.87–5.11)
RBC: 2.81 MIL/uL — ABNORMAL LOW (ref 3.87–5.11)
RBC: 2.84 MIL/uL — ABNORMAL LOW (ref 3.87–5.11)
RBC: 2.91 MIL/uL — ABNORMAL LOW (ref 3.87–5.11)
RBC: 2.99 MIL/uL — ABNORMAL LOW (ref 3.87–5.11)
RBC: 3 MIL/uL — ABNORMAL LOW (ref 3.87–5.11)
RBC: 3.01 MIL/uL — ABNORMAL LOW (ref 3.87–5.11)
RBC: 3.16 MIL/uL — ABNORMAL LOW (ref 3.87–5.11)
RBC: 3.2 MIL/uL — ABNORMAL LOW (ref 3.87–5.11)
RDW: 17.4 % — ABNORMAL HIGH (ref 11.5–15.5)
RDW: 17.3 % — ABNORMAL HIGH (ref 11.5–15.5)
RDW: 17.6 % — ABNORMAL HIGH (ref 11.5–15.5)
RDW: 17.9 % — ABNORMAL HIGH (ref 11.5–15.5)
RDW: 18 % — ABNORMAL HIGH (ref 11.5–15.5)
RDW: 18.1 % — ABNORMAL HIGH (ref 11.5–15.5)
RDW: 18.3 % — ABNORMAL HIGH (ref 11.5–15.5)
RDW: 18.5 % — ABNORMAL HIGH (ref 11.5–15.5)
WBC: 11.7 10*3/uL — ABNORMAL HIGH (ref 4.0–10.5)
WBC: 17.8 10*3/uL — ABNORMAL HIGH (ref 4.0–10.5)
WBC: 11 10*3/uL — ABNORMAL HIGH (ref 4.0–10.5)
WBC: 11.2 10*3/uL — ABNORMAL HIGH (ref 4.0–10.5)
WBC: 12.1 10*3/uL — ABNORMAL HIGH (ref 4.0–10.5)
WBC: 14.4 10*3/uL — ABNORMAL HIGH (ref 4.0–10.5)
WBC: 9.2 10*3/uL (ref 4.0–10.5)
WBC: 9.2 10*3/uL (ref 4.0–10.5)

## 2010-05-05 LAB — COMPREHENSIVE METABOLIC PANEL
ALT: 42 U/L — ABNORMAL HIGH (ref 0–35)
ALT: 16 U/L (ref 0–35)
AST: 19 U/L (ref 0–37)
Albumin: 1.8 g/dL — ABNORMAL LOW (ref 3.5–5.2)
Alkaline Phosphatase: 106 U/L (ref 39–117)
BUN: 54 mg/dL — ABNORMAL HIGH (ref 6–23)
BUN: 55 mg/dL — ABNORMAL HIGH (ref 6–23)
CO2: 26 mEq/L (ref 19–32)
CO2: 35 mEq/L — ABNORMAL HIGH (ref 19–32)
Calcium: 10.2 mg/dL (ref 8.4–10.5)
Calcium: 9.3 mg/dL (ref 8.4–10.5)
Chloride: 93 mEq/L — ABNORMAL LOW (ref 96–112)
Creatinine, Ser: 2.44 mg/dL — ABNORMAL HIGH (ref 0.4–1.2)
GFR calc Af Amer: 24 mL/min — ABNORMAL LOW (ref >60)
GFR calc non Af Amer: 19 mL/min — ABNORMAL LOW (ref >60)
GFR calc non Af Amer: 20 mL/min — ABNORMAL LOW (ref >60)
Glucose, Bld: 165 mg/dL — ABNORMAL HIGH (ref 70–99)
Glucose, Bld: 220 mg/dL — ABNORMAL HIGH (ref 70–99)
Sodium: 133 mEq/L — ABNORMAL LOW (ref 135–145)
Sodium: 134 mEq/L — ABNORMAL LOW (ref 135–145)
Sodium: 134 mEq/L — ABNORMAL LOW (ref 135–145)
Total Bilirubin: 0.3 mg/dL (ref 0.3–1.2)
Total Protein: 5.1 g/dL — ABNORMAL LOW (ref 6.0–8.3)
Total Protein: 6.5 g/dL (ref 6.0–8.3)

## 2010-05-05 LAB — BASIC METABOLIC PANEL
BUN: 32 mg/dL — ABNORMAL HIGH (ref 6–23)
BUN: 45 mg/dL — ABNORMAL HIGH (ref 6–23)
CO2: 28 mEq/L (ref 19–32)
CO2: 29 mEq/L (ref 19–32)
CO2: 29 mEq/L (ref 19–32)
Calcium: 10 mg/dL (ref 8.4–10.5)
Calcium: 8.8 mg/dL (ref 8.4–10.5)
Calcium: 9 mg/dL (ref 8.4–10.5)
Calcium: 9.2 mg/dL (ref 8.4–10.5)
Chloride: 105 mEq/L (ref 96–112)
Chloride: 98 mEq/L (ref 96–112)
Chloride: 98 mEq/L (ref 96–112)
Chloride: 99 mEq/L (ref 96–112)
Creatinine, Ser: 2.24 mg/dL — ABNORMAL HIGH (ref 0.4–1.2)
Creatinine, Ser: 2.43 mg/dL — ABNORMAL HIGH (ref 0.4–1.2)
Creatinine, Ser: 3.24 mg/dL — ABNORMAL HIGH (ref 0.4–1.2)
Creatinine, Ser: 3.79 mg/dL — ABNORMAL HIGH (ref 0.4–1.2)
GFR calc Af Amer: 24 mL/min — ABNORMAL LOW (ref >60)
GFR calc Af Amer: 14 mL/min — ABNORMAL LOW (ref >60)
GFR calc Af Amer: 17 mL/min — ABNORMAL LOW (ref >60)
GFR calc Af Amer: 24 mL/min — ABNORMAL LOW (ref >60)
GFR calc Af Amer: 26 mL/min — ABNORMAL LOW (ref >60)
GFR calc non Af Amer: 20 mL/min — ABNORMAL LOW (ref >60)
GFR calc non Af Amer: 13 mL/min — ABNORMAL LOW (ref >60)
GFR calc non Af Amer: 22 mL/min — ABNORMAL LOW (ref >60)
Glucose, Bld: 134 mg/dL — ABNORMAL HIGH (ref 70–99)
Glucose, Bld: 178 mg/dL — ABNORMAL HIGH (ref 70–99)
Potassium: 4.9 mEq/L (ref 3.5–5.1)
Potassium: 3.7 mEq/L (ref 3.5–5.1)
Potassium: 3.7 mEq/L (ref 3.5–5.1)
Potassium: 3.9 mEq/L (ref 3.5–5.1)
Sodium: 136 mEq/L (ref 135–145)
Sodium: 131 mEq/L — ABNORMAL LOW (ref 135–145)
Sodium: 135 mEq/L (ref 135–145)
Sodium: 136 mEq/L (ref 135–145)
Sodium: 136 mEq/L (ref 135–145)

## 2010-05-05 LAB — POCT I-STAT 3, ART BLOOD GAS (G3+)
Bicarbonate: 25.4 mEq/L — ABNORMAL HIGH (ref 20.0–24.0)
O2 Saturation: 92 %
O2 Saturation: 93 %
TCO2: 26 mmol/L (ref 0–100)
TCO2: 35 mmol/L (ref 0–100)
pCO2 arterial: 34.2 mmHg — ABNORMAL LOW (ref 35.0–45.0)
pCO2 arterial: 45.3 mmHg — ABNORMAL HIGH (ref 35.0–45.0)
pCO2 arterial: 99.9 mmHg (ref 35.0–45.0)
pH, Arterial: 7.112 — CL (ref 7.350–7.400)
pO2, Arterial: 63 mmHg — ABNORMAL LOW (ref 80.0–100.0)
pO2, Arterial: 66 mmHg — ABNORMAL LOW (ref 80.0–100.0)

## 2010-05-05 LAB — URINALYSIS, ROUTINE W REFLEX MICROSCOPIC
Bilirubin Urine: NEGATIVE
Ketones, ur: NEGATIVE mg/dL
Nitrite: NEGATIVE
Nitrite: NEGATIVE
Protein, ur: NEGATIVE mg/dL
Specific Gravity, Urine: 1.013 (ref 1.005–1.030)
pH: 5 (ref 5.0–8.0)
pH: 5 (ref 5.0–8.0)

## 2010-05-05 LAB — BLOOD GAS, ARTERIAL
Bicarbonate: 24.7 mEq/L — ABNORMAL HIGH (ref 20.0–24.0)
Drawn by: 249101
O2 Content: 2 L/min
O2 Saturation: 93.8 %
PEEP: 5 cmH2O
RATE: 12 resp/min
pCO2 arterial: 40.8 mmHg (ref 35.0–45.0)
pH, Arterial: 7.4 (ref 7.350–7.400)
pO2, Arterial: 58 mmHg — ABNORMAL LOW (ref 80.0–100.0)
pO2, Arterial: 84.7 mmHg (ref 80.0–100.0)

## 2010-05-05 LAB — GLUCOSE, CAPILLARY
Glucose-Capillary: 165 mg/dL — ABNORMAL HIGH (ref 70–99)
Glucose-Capillary: 166 mg/dL — ABNORMAL HIGH (ref 70–99)
Glucose-Capillary: 202 mg/dL — ABNORMAL HIGH (ref 70–99)
Glucose-Capillary: 104 mg/dL — ABNORMAL HIGH (ref 70–99)
Glucose-Capillary: 111 mg/dL — ABNORMAL HIGH (ref 70–99)
Glucose-Capillary: 114 mg/dL — ABNORMAL HIGH (ref 70–99)
Glucose-Capillary: 127 mg/dL — ABNORMAL HIGH (ref 70–99)
Glucose-Capillary: 128 mg/dL — ABNORMAL HIGH (ref 70–99)
Glucose-Capillary: 130 mg/dL — ABNORMAL HIGH (ref 70–99)
Glucose-Capillary: 132 mg/dL — ABNORMAL HIGH (ref 70–99)
Glucose-Capillary: 138 mg/dL — ABNORMAL HIGH (ref 70–99)
Glucose-Capillary: 149 mg/dL — ABNORMAL HIGH (ref 70–99)
Glucose-Capillary: 149 mg/dL — ABNORMAL HIGH (ref 70–99)
Glucose-Capillary: 151 mg/dL — ABNORMAL HIGH (ref 70–99)
Glucose-Capillary: 168 mg/dL — ABNORMAL HIGH (ref 70–99)
Glucose-Capillary: 170 mg/dL — ABNORMAL HIGH (ref 70–99)
Glucose-Capillary: 170 mg/dL — ABNORMAL HIGH (ref 70–99)
Glucose-Capillary: 181 mg/dL — ABNORMAL HIGH (ref 70–99)
Glucose-Capillary: 194 mg/dL — ABNORMAL HIGH (ref 70–99)
Glucose-Capillary: 199 mg/dL — ABNORMAL HIGH (ref 70–99)
Glucose-Capillary: 202 mg/dL — ABNORMAL HIGH (ref 70–99)
Glucose-Capillary: 205 mg/dL — ABNORMAL HIGH (ref 70–99)
Glucose-Capillary: 214 mg/dL — ABNORMAL HIGH (ref 70–99)
Glucose-Capillary: 226 mg/dL — ABNORMAL HIGH (ref 70–99)
Glucose-Capillary: 64 mg/dL — ABNORMAL LOW (ref 70–99)
Glucose-Capillary: 71 mg/dL (ref 70–99)

## 2010-05-05 LAB — RENAL FUNCTION PANEL
Albumin: 1.8 g/dL — ABNORMAL LOW (ref 3.5–5.2)
Albumin: 1.9 g/dL — ABNORMAL LOW (ref 3.5–5.2)
BUN: 37 mg/dL — ABNORMAL HIGH (ref 6–23)
BUN: 39 mg/dL — ABNORMAL HIGH (ref 6–23)
CO2: 30 mEq/L (ref 19–32)
CO2: 32 mEq/L (ref 19–32)
CO2: 34 mEq/L — ABNORMAL HIGH (ref 19–32)
Calcium: 10.2 mg/dL (ref 8.4–10.5)
Chloride: 94 mEq/L — ABNORMAL LOW (ref 96–112)
Chloride: 95 mEq/L — ABNORMAL LOW (ref 96–112)
Chloride: 96 mEq/L (ref 96–112)
Chloride: 99 mEq/L (ref 96–112)
Creatinine, Ser: 3.14 mg/dL — ABNORMAL HIGH (ref 0.4–1.2)
GFR calc Af Amer: 21 mL/min — ABNORMAL LOW (ref >60)
GFR calc non Af Amer: 18 mL/min — ABNORMAL LOW (ref >60)
Glucose, Bld: 165 mg/dL — ABNORMAL HIGH (ref 70–99)
Glucose, Bld: 76 mg/dL (ref 70–99)
Glucose, Bld: 99 mg/dL (ref 70–99)
Phosphorus: 3.3 mg/dL (ref 2.3–4.6)
Potassium: 3.7 mEq/L (ref 3.5–5.1)
Potassium: 3.8 mEq/L (ref 3.5–5.1)
Potassium: 3.9 mEq/L (ref 3.5–5.1)
Sodium: 134 mEq/L — ABNORMAL LOW (ref 135–145)
Sodium: 134 mEq/L — ABNORMAL LOW (ref 135–145)
Sodium: 137 mEq/L (ref 135–145)

## 2010-05-05 LAB — DIFFERENTIAL
Eosinophils Absolute: 0 10*3/uL (ref 0.0–0.7)
Eosinophils Absolute: 0.3 10*3/uL (ref 0.0–0.7)
Eosinophils Relative: 0 % (ref 0–5)
Lymphocytes Relative: 5 % — ABNORMAL LOW (ref 12–46)
Lymphs Abs: 0.6 10*3/uL — ABNORMAL LOW (ref 0.7–4.0)
Lymphs Abs: 0.9 10*3/uL (ref 0.7–4.0)
Lymphs Abs: 1.7 10*3/uL (ref 0.7–4.0)
Monocytes Absolute: 0.5 10*3/uL (ref 0.1–1.0)
Monocytes Relative: 1 % — ABNORMAL LOW (ref 3–12)
Monocytes Relative: 3 % (ref 3–12)
Monocytes Relative: 10 % (ref 3–12)
Neutro Abs: 16 10*3/uL — ABNORMAL HIGH (ref 1.7–7.7)
Neutrophils Relative %: 95 % — ABNORMAL HIGH (ref 43–77)
Neutrophils Relative %: 68 % (ref 43–77)

## 2010-05-05 LAB — CARDIAC PANEL(CRET KIN+CKTOT+MB+TROPI)
CK, MB: 0.6 ng/mL (ref 0.3–4.0)
CK, MB: 0.6 ng/mL (ref 0.3–4.0)
Relative Index: INVALID (ref 0.0–2.5)
Total CK: 7 U/L (ref 7–177)
Total CK: <7 U/L — ABNORMAL LOW (ref 7–177)
Troponin I: 0.06 ng/mL (ref 0.00–0.06)

## 2010-05-05 LAB — MAGNESIUM
Magnesium: 1.2 mg/dL — ABNORMAL LOW (ref 1.5–2.5)
Magnesium: 1 mg/dL — ABNORMAL LOW (ref 1.5–2.5)
Magnesium: 1.3 mg/dL — ABNORMAL LOW (ref 1.5–2.5)
Magnesium: 1.4 mg/dL — ABNORMAL LOW (ref 1.5–2.5)
Magnesium: 1.9 mg/dL (ref 1.5–2.5)

## 2010-05-05 LAB — URINE CULTURE
Colony Count: NO GROWTH
Culture: NO GROWTH

## 2010-05-05 LAB — PHOSPHORUS
Phosphorus: 2.5 mg/dL (ref 2.3–4.6)
Phosphorus: 3.7 mg/dL (ref 2.3–4.6)
Phosphorus: 3.4 mg/dL (ref 2.3–4.6)

## 2010-05-05 LAB — CULTURE, BLOOD (ROUTINE X 2): Culture: NO GROWTH

## 2010-05-05 LAB — CK TOTAL AND CKMB (NOT AT ARMC)
Relative Index: INVALID (ref 0.0–2.5)
Total CK: 11 U/L (ref 7–177)

## 2010-05-05 LAB — PROCALCITONIN: Procalcitonin: 2.77 ng/mL

## 2010-05-05 LAB — HEPARIN LEVEL (UNFRACTIONATED)
Heparin Unfractionated: 0.73 IU/mL — ABNORMAL HIGH (ref 0.30–0.70)
Heparin Unfractionated: 0.19 IU/mL — ABNORMAL LOW (ref 0.30–0.70)
Heparin Unfractionated: 0.21 IU/mL — ABNORMAL LOW (ref 0.30–0.70)
Heparin Unfractionated: 0.23 IU/mL — ABNORMAL LOW (ref 0.30–0.70)
Heparin Unfractionated: 0.29 IU/mL — ABNORMAL LOW (ref 0.30–0.70)
Heparin Unfractionated: 0.46 IU/mL (ref 0.30–0.70)
Heparin Unfractionated: 0.54 IU/mL (ref 0.30–0.70)
Heparin Unfractionated: 0.55 IU/mL (ref 0.30–0.70)

## 2010-05-05 LAB — PROTIME-INR
INR: 1.23 (ref 0.00–1.49)
INR: 1.28 (ref 0.00–1.49)
INR: 1.34 (ref 0.00–1.49)
INR: 1.4 (ref 0.00–1.49)
INR: 1.75 — ABNORMAL HIGH (ref 0.00–1.49)
INR: 2.9 — ABNORMAL HIGH (ref 0.00–1.49)
Prothrombin Time: 14.6 seconds (ref 11.6–15.2)
Prothrombin Time: 15.4 seconds — ABNORMAL HIGH (ref 11.6–15.2)
Prothrombin Time: 16.5 seconds — ABNORMAL HIGH (ref 11.6–15.2)
Prothrombin Time: 28.4 seconds — ABNORMAL HIGH (ref 11.6–15.2)

## 2010-05-05 LAB — BRAIN NATRIURETIC PEPTIDE: Pro B Natriuretic peptide (BNP): 843 pg/mL — ABNORMAL HIGH (ref 0.0–100.0)

## 2010-05-05 LAB — POCT CARDIAC MARKERS: Myoglobin, poc: 147 ng/mL (ref 12–200)

## 2010-05-05 LAB — URINE MICROSCOPIC-ADD ON

## 2010-05-05 LAB — CULTURE, RESPIRATORY W GRAM STAIN

## 2010-05-05 LAB — POCT I-STAT, CHEM 8
Calcium, Ion: 1.33 mmol/L — ABNORMAL HIGH (ref 1.12–1.32)
Glucose, Bld: 188 mg/dL — ABNORMAL HIGH (ref 70–99)
HCT: 38 % (ref 36.0–46.0)
Hemoglobin: 12.9 g/dL (ref 12.0–15.0)
Potassium: 7.6 mEq/L (ref 3.5–5.1)
Sodium: 131 mEq/L — ABNORMAL LOW (ref 135–145)

## 2010-05-05 LAB — LACTIC ACID, PLASMA: Lactic Acid, Venous: 1.8 mmol/L (ref 0.5–2.2)

## 2010-05-05 LAB — TYPE AND SCREEN: Antibody Screen: NEGATIVE

## 2010-05-05 LAB — MRSA PCR SCREENING: MRSA by PCR: NEGATIVE

## 2010-05-06 LAB — CARDIAC PANEL(CRET KIN+CKTOT+MB+TROPI)
CK, MB: 0.9 ng/mL (ref 0.3–4.0)
Relative Index: INVALID (ref 0.0–2.5)
Relative Index: INVALID (ref 0.0–2.5)
Total CK: 21 U/L (ref 7–177)
Troponin I: 0.04 ng/mL (ref 0.00–0.06)
Troponin I: 0.04 ng/mL (ref 0.00–0.06)
Troponin I: 0.06 ng/mL (ref 0.00–0.06)

## 2010-05-06 LAB — URINALYSIS, ROUTINE W REFLEX MICROSCOPIC
Bilirubin Urine: NEGATIVE
Bilirubin Urine: NEGATIVE
Glucose, UA: NEGATIVE mg/dL
Ketones, ur: NEGATIVE mg/dL
Leukocytes, UA: NEGATIVE
Nitrite: NEGATIVE
Protein, ur: NEGATIVE mg/dL
Specific Gravity, Urine: 1.008 (ref 1.005–1.030)
Urobilinogen, UA: 0.2 mg/dL (ref 0.0–1.0)
pH: 5.5 (ref 5.0–8.0)

## 2010-05-06 LAB — BLOOD GAS, ARTERIAL
Acid-base deficit: 2 mmol/L (ref 0.0–2.0)
Bicarbonate: 23.7 mEq/L (ref 20.0–24.0)
FIO2: 0.3 %
MECHVT: 550 mL
Patient temperature: 97.7
TCO2: 25.2 mmol/L (ref 0–100)

## 2010-05-06 LAB — CK TOTAL AND CKMB (NOT AT ARMC)
CK, MB: 0.6 ng/mL (ref 0.3–4.0)
Relative Index: INVALID (ref 0.0–2.5)
Total CK: 19 U/L (ref 7–177)

## 2010-05-06 LAB — GLUCOSE, CAPILLARY
Glucose-Capillary: 110 mg/dL — ABNORMAL HIGH (ref 70–99)
Glucose-Capillary: 119 mg/dL — ABNORMAL HIGH (ref 70–99)
Glucose-Capillary: 123 mg/dL — ABNORMAL HIGH (ref 70–99)
Glucose-Capillary: 128 mg/dL — ABNORMAL HIGH (ref 70–99)
Glucose-Capillary: 146 mg/dL — ABNORMAL HIGH (ref 70–99)
Glucose-Capillary: 146 mg/dL — ABNORMAL HIGH (ref 70–99)
Glucose-Capillary: 147 mg/dL — ABNORMAL HIGH (ref 70–99)
Glucose-Capillary: 170 mg/dL — ABNORMAL HIGH (ref 70–99)
Glucose-Capillary: 75 mg/dL (ref 70–99)
Glucose-Capillary: 94 mg/dL (ref 70–99)

## 2010-05-06 LAB — COMPREHENSIVE METABOLIC PANEL
ALT: 26 U/L (ref 0–35)
Albumin: 2.5 g/dL — ABNORMAL LOW (ref 3.5–5.2)
Albumin: 2.6 g/dL — ABNORMAL LOW (ref 3.5–5.2)
Alkaline Phosphatase: 124 U/L — ABNORMAL HIGH (ref 39–117)
BUN: 50 mg/dL — ABNORMAL HIGH (ref 6–23)
Calcium: 9.1 mg/dL (ref 8.4–10.5)
Calcium: 9.3 mg/dL (ref 8.4–10.5)
Creatinine, Ser: 2.71 mg/dL — ABNORMAL HIGH (ref 0.4–1.2)
GFR calc Af Amer: 21 mL/min — ABNORMAL LOW (ref >60)
Glucose, Bld: 154 mg/dL — ABNORMAL HIGH (ref 70–99)
Potassium: 3.2 mEq/L — ABNORMAL LOW (ref 3.5–5.1)
Sodium: 135 mEq/L (ref 135–145)
Total Bilirubin: 0.5 mg/dL (ref 0.3–1.2)
Total Protein: 6.2 g/dL (ref 6.0–8.3)
Total Protein: 6.2 g/dL (ref 6.0–8.3)

## 2010-05-06 LAB — POCT I-STAT 3, ART BLOOD GAS (G3+)
Acid-Base Excess: 1 mmol/L (ref 0.0–2.0)
Bicarbonate: 27.9 mEq/L — ABNORMAL HIGH (ref 20.0–24.0)
Patient temperature: 37
TCO2: 25 mmol/L (ref 0–100)
pCO2 arterial: 34.2 mmHg — ABNORMAL LOW (ref 35.0–45.0)
pH, Arterial: 7.339 — ABNORMAL LOW (ref 7.350–7.400)
pH, Arterial: 7.451 — ABNORMAL HIGH (ref 7.350–7.400)

## 2010-05-06 LAB — BASIC METABOLIC PANEL
BUN: 34 mg/dL — ABNORMAL HIGH (ref 6–23)
BUN: 38 mg/dL — ABNORMAL HIGH (ref 6–23)
CO2: 27 mEq/L (ref 19–32)
CO2: 28 mEq/L (ref 19–32)
CO2: 31 mEq/L (ref 19–32)
Calcium: 8.7 mg/dL (ref 8.4–10.5)
Calcium: 9 mg/dL (ref 8.4–10.5)
Calcium: 9.1 mg/dL (ref 8.4–10.5)
Chloride: 105 mEq/L (ref 96–112)
Creatinine, Ser: 2.28 mg/dL — ABNORMAL HIGH (ref 0.4–1.2)
GFR calc Af Amer: 23 mL/min — ABNORMAL LOW (ref >60)
GFR calc non Af Amer: 21 mL/min — ABNORMAL LOW (ref >60)
GFR calc non Af Amer: 21 mL/min — ABNORMAL LOW (ref >60)
Glucose, Bld: 101 mg/dL — ABNORMAL HIGH (ref 70–99)
Glucose, Bld: 114 mg/dL — ABNORMAL HIGH (ref 70–99)
Glucose, Bld: 134 mg/dL — ABNORMAL HIGH (ref 70–99)
Potassium: 3.5 mEq/L (ref 3.5–5.1)
Potassium: 3.7 mEq/L (ref 3.5–5.1)
Potassium: 3.9 mEq/L (ref 3.5–5.1)
Potassium: 4 mEq/L (ref 3.5–5.1)
Sodium: 136 mEq/L (ref 135–145)
Sodium: 138 mEq/L (ref 135–145)
Sodium: 139 mEq/L (ref 135–145)

## 2010-05-06 LAB — POCT I-STAT, CHEM 8
BUN: 55 mg/dL — ABNORMAL HIGH (ref 6–23)
Chloride: 100 mEq/L (ref 96–112)
HCT: 44 % (ref 36.0–46.0)
Hemoglobin: 15 g/dL (ref 12.0–15.0)
Potassium: 3.9 mEq/L (ref 3.5–5.1)
Sodium: 136 mEq/L (ref 135–145)
TCO2: 33 mmol/L (ref 0–100)

## 2010-05-06 LAB — PROTIME-INR
INR: 1.51 — ABNORMAL HIGH (ref 0.00–1.49)
INR: 2.15 — ABNORMAL HIGH (ref 0.00–1.49)
INR: 2.23 — ABNORMAL HIGH (ref 0.00–1.49)
Prothrombin Time: 23.8 seconds — ABNORMAL HIGH (ref 11.6–15.2)

## 2010-05-06 LAB — URINE MICROSCOPIC-ADD ON

## 2010-05-06 LAB — DIFFERENTIAL
Basophils Relative: 1 % (ref 0–1)
Monocytes Relative: 7 % (ref 3–12)
Neutro Abs: 7.9 10*3/uL — ABNORMAL HIGH (ref 1.7–7.7)
Neutrophils Relative %: 72 % (ref 43–77)

## 2010-05-06 LAB — CBC
HCT: 32.1 % — ABNORMAL LOW (ref 36.0–46.0)
HCT: 33.2 % — ABNORMAL LOW (ref 36.0–46.0)
HCT: 33.6 % — ABNORMAL LOW (ref 36.0–46.0)
HCT: 36.5 % (ref 36.0–46.0)
Hemoglobin: 10.8 g/dL — ABNORMAL LOW (ref 12.0–15.0)
Hemoglobin: 10.8 g/dL — ABNORMAL LOW (ref 12.0–15.0)
Hemoglobin: 9.6 g/dL — ABNORMAL LOW (ref 12.0–15.0)
MCHC: 32.1 g/dL (ref 30.0–36.0)
MCHC: 32.1 g/dL (ref 30.0–36.0)
MCHC: 32.3 g/dL (ref 30.0–36.0)
MCHC: 32.6 g/dL (ref 30.0–36.0)
MCHC: 32.8 g/dL (ref 30.0–36.0)
MCHC: 33.1 g/dL (ref 30.0–36.0)
MCV: 92.5 fL (ref 78.0–100.0)
MCV: 93.8 fL (ref 78.0–100.0)
MCV: 94 fL (ref 78.0–100.0)
Platelets: 196 10*3/uL (ref 150–400)
Platelets: 232 10*3/uL (ref 150–400)
Platelets: 274 10*3/uL (ref 150–400)
RBC: 3.58 MIL/uL — ABNORMAL LOW (ref 3.87–5.11)
RBC: 4.39 MIL/uL (ref 3.87–5.11)
RDW: 17.7 % — ABNORMAL HIGH (ref 11.5–15.5)
RDW: 17.8 % — ABNORMAL HIGH (ref 11.5–15.5)
RDW: 17.8 % — ABNORMAL HIGH (ref 11.5–15.5)
RDW: 17.9 % — ABNORMAL HIGH (ref 11.5–15.5)
RDW: 18 % — ABNORMAL HIGH (ref 11.5–15.5)
WBC: 10.9 10*3/uL — ABNORMAL HIGH (ref 4.0–10.5)
WBC: 9.6 10*3/uL (ref 4.0–10.5)

## 2010-05-06 LAB — LIPID PANEL
HDL: 36 mg/dL — ABNORMAL LOW (ref >39)
Triglycerides: 211 mg/dL — ABNORMAL HIGH (ref <150)
VLDL: 42 mg/dL — ABNORMAL HIGH (ref 0–40)

## 2010-05-06 LAB — CULTURE, BLOOD (ROUTINE X 2)

## 2010-05-06 LAB — HEMOGLOBIN A1C: Hgb A1c MFr Bld: 7.3 % — ABNORMAL HIGH (ref 4.6–6.1)

## 2010-05-06 LAB — URINE CULTURE
Colony Count: NO GROWTH
Culture: NO GROWTH

## 2010-05-06 LAB — MAGNESIUM: Magnesium: 1.1 mg/dL — ABNORMAL LOW (ref 1.5–2.5)

## 2010-05-06 LAB — T4, FREE: Free T4: 1.05 ng/dL (ref 0.80–1.80)

## 2010-05-06 LAB — TROPONIN I: Troponin I: 0.04 ng/mL (ref 0.00–0.06)

## 2010-05-06 LAB — SODIUM, URINE, RANDOM: Sodium, Ur: 91 mEq/L

## 2010-05-06 LAB — CREATININE, URINE, RANDOM: Creatinine, Urine: 36.5 mg/dL

## 2010-05-06 LAB — LACTIC ACID, PLASMA: Lactic Acid, Venous: 1.6 mmol/L (ref 0.5–2.2)

## 2010-05-06 LAB — WOUND CULTURE

## 2010-05-06 LAB — TSH: TSH: 3.738 u[IU]/mL (ref 0.350–4.500)

## 2010-05-25 LAB — COMPREHENSIVE METABOLIC PANEL
BUN: 77 mg/dL — ABNORMAL HIGH (ref 6–23)
Calcium: 9.3 mg/dL (ref 8.4–10.5)
Creatinine, Ser: 2.27 mg/dL — ABNORMAL HIGH (ref 0.4–1.2)
Glucose, Bld: 266 mg/dL — ABNORMAL HIGH (ref 70–99)
Sodium: 139 mEq/L (ref 135–145)
Total Protein: 6.7 g/dL (ref 6.0–8.3)

## 2010-05-25 LAB — RENAL FUNCTION PANEL
Albumin: 2.2 g/dL — ABNORMAL LOW (ref 3.5–5.2)
BUN: 92 mg/dL — ABNORMAL HIGH (ref 6–23)
CO2: 29 mEq/L (ref 19–32)
Chloride: 92 mEq/L — ABNORMAL LOW (ref 96–112)
Glucose, Bld: 153 mg/dL — ABNORMAL HIGH (ref 70–99)
Potassium: 3.3 mEq/L — ABNORMAL LOW (ref 3.5–5.1)

## 2010-05-25 LAB — URINALYSIS, ROUTINE W REFLEX MICROSCOPIC
Protein, ur: NEGATIVE mg/dL
Specific Gravity, Urine: 1.007 (ref 1.005–1.030)
Urobilinogen, UA: 0.2 mg/dL (ref 0.0–1.0)

## 2010-05-25 LAB — GLUCOSE, CAPILLARY
Glucose-Capillary: 140 mg/dL — ABNORMAL HIGH (ref 70–99)
Glucose-Capillary: 149 mg/dL — ABNORMAL HIGH (ref 70–99)
Glucose-Capillary: 162 mg/dL — ABNORMAL HIGH (ref 70–99)
Glucose-Capillary: 169 mg/dL — ABNORMAL HIGH (ref 70–99)
Glucose-Capillary: 173 mg/dL — ABNORMAL HIGH (ref 70–99)

## 2010-05-25 LAB — CBC
HCT: 33.8 % — ABNORMAL LOW (ref 36.0–46.0)
HCT: 34.6 % — ABNORMAL LOW (ref 36.0–46.0)
Hemoglobin: 12.3 g/dL (ref 12.0–15.0)
Hemoglobin: 11.1 g/dL — ABNORMAL LOW (ref 12.0–15.0)
MCHC: 32.4 g/dL (ref 30.0–36.0)
MCHC: 32.6 g/dL (ref 30.0–36.0)
MCHC: 32.1 g/dL (ref 30.0–36.0)
MCV: 90.3 fL (ref 78.0–100.0)
Platelets: 298 10*3/uL (ref 150–400)
RBC: 4.21 MIL/uL (ref 3.87–5.11)
RDW: 17.4 % — ABNORMAL HIGH (ref 11.5–15.5)
RDW: 16.4 % — ABNORMAL HIGH (ref 11.5–15.5)

## 2010-05-25 LAB — PROTIME-INR
INR: 3.5 — ABNORMAL HIGH (ref 0.00–1.49)
INR: 2.7 — ABNORMAL HIGH (ref 0.00–1.49)

## 2010-05-25 LAB — BASIC METABOLIC PANEL
CO2: 31 mEq/L (ref 19–32)
CO2: 32 mEq/L (ref 19–32)
Calcium: 8.7 mg/dL (ref 8.4–10.5)
Calcium: 9.5 mg/dL (ref 8.4–10.5)
Creatinine, Ser: 2.92 mg/dL — ABNORMAL HIGH (ref 0.4–1.2)
GFR calc Af Amer: 19 mL/min — ABNORMAL LOW (ref >60)
GFR calc non Af Amer: 16 mL/min — ABNORMAL LOW (ref >60)
Glucose, Bld: 165 mg/dL — ABNORMAL HIGH (ref 70–99)
Sodium: 135 mEq/L (ref 135–145)
Sodium: 140 mEq/L (ref 135–145)

## 2010-05-25 LAB — POCT CARDIAC MARKERS
CKMB, poc: <1 ng/mL — ABNORMAL LOW (ref 1.0–8.0)
Myoglobin, poc: 493 ng/mL (ref 12–200)

## 2010-05-25 LAB — DIFFERENTIAL
Basophils Absolute: 0.1 10*3/uL (ref 0.0–0.1)
Basophils Relative: 0 % (ref 0–1)
Lymphocytes Relative: 16 % (ref 12–46)
Lymphs Abs: 1 10*3/uL (ref 0.7–4.0)
Monocytes Relative: 9 % (ref 3–12)
Monocytes Relative: 7 % (ref 3–12)
Neutro Abs: 8.9 10*3/uL — ABNORMAL HIGH (ref 1.7–7.7)
Neutro Abs: 11 10*3/uL — ABNORMAL HIGH (ref 1.7–7.7)
Neutrophils Relative %: 74 % (ref 43–77)
Neutrophils Relative %: 85 % — ABNORMAL HIGH (ref 43–77)

## 2010-05-25 LAB — HEPATIC FUNCTION PANEL
Alkaline Phosphatase: 226 U/L — ABNORMAL HIGH (ref 39–117)
Bilirubin, Direct: 0.2 mg/dL (ref 0.0–0.3)
Indirect Bilirubin: 0.3 mg/dL (ref 0.3–0.9)
Total Bilirubin: 0.5 mg/dL (ref 0.3–1.2)

## 2010-05-25 LAB — APTT: aPTT: 61 seconds — ABNORMAL HIGH (ref 24–37)

## 2010-05-25 LAB — LIPASE, BLOOD: Lipase: 21 U/L (ref 11–59)

## 2010-06-30 NOTE — Assessment & Plan Note (Signed)
OFFICE VISIT   [name] Mason, [name] Mason  DOB:  02-02-1936                                       10/21/2009  JXBJY#:[id number]   The patient had a left upper arm AV fistula created by Dr. Imogene Burn on  August 19 for end-stage renal disease.  This is her first access site.  She developed venous hypertension and a very swollen arm and the fistula  ligated by Dr. Arbie Cookey on September 1 .  The patient's swelling has  improved slightly but continues to be moderately swollen from the axilla  to the fingers.  She returns back for further follow-up.   On examination the incision in the distal upper arm is healing nicely  with sutures intact.  She does have moderate edema from the axilla to  the hand including the fingers.  She has a palpable radial pulse  distally.  No evidence of steal and the fistula is not patent with no  pulse or thrill.  I explained to her that we would like to wait about 3  weeks to let the swelling go down and then Dr. Imogene Burn will evaluate her in  the office for possible access in the right upper extremity.  She is  scheduled for 3 weeks from now with vein mapping of the right arm to  be  seen by Dr.  Imogene Burn at that time.     Quita Skye Hart [place], M.D.  Electronically Signed   JDL/MEDQ  D:  10/21/2009  T:  10/22/2009  Job:  [id number]

## 2010-06-30 NOTE — Assessment & Plan Note (Signed)
OFFICE VISIT   [name] Mason, [name] Mason  DOB:  [birth year]                                       11/21/2009  QIONG#:[id number]   HISTORY OF PRESENT ILLNESS:  This is a 75 year old female who recently  underwent a venogram to evaluate her central veins.  Previously had  undergone a left brachiocephalic fistula that was complicated by venous  hypertension due to outflow stenosis related to permanent pacemaker. She  has now had a right internal jugular tunneled dialysis catheter in place  for over approximately 3 weeks.  The concern was whether or not this  would induce central venous stenosis and cause her yet again to have  venous outflow stenosis.  She underwent a venogram which demonstrates in  fact a patent basilic vein that was about 0.38 cm in diameter on digital  calipers measurement.  This is substantially larger than what was found  on vein mapping.  Additionally noted widely patent venous outflow and  that the [facility] in fact only occupied less than a third of the superior  vena cava.  Based on these findings, we discussed the possibility of a  staged basilic vein transposition.  Of note, on her left arm she notes  some decreased swelling in that arm, some continued discomfort and sense  of fullness in that hand but notes no steal symptoms and is able to use  her left hand for activities of daily living.   Her past medical history past surgical history, review of systems,  social history, family history and allergies unchanged from our  consultation when she was in the hospital.   PHYSICAL EXAMINATION:  Today, vital signs showed temperature 98.0  degrees Fahrenheit, blood pressure 126/85, heart rate of 60,  respirations 12.  On focused exam, on her right arm there is any easily palpable brachial  pulse and radial pulse on this arm.  Intrinsic and extrinsic motor  strength in this hand are good.  Intact sensation in the distal  fingertips.  Looking at the left  arm, the swelling is decreased from  previous evaluation.  The incisions are well-healed at this point.  She  does have intact hand grip in this hand, and grossly there is sensation  in the fingertips.   MEDICAL DECISION MAKING:  This is a 75 year old female who has end-stage  renal disease with hemodialysis requirement.  Based on her venous  outflow study, she should be able to undergo a staged right basilic vein  transposition without expectation of any venous hypertension at this  time.  I discussed with her the nature of a basilic vein transposition  and the staged process.  This is in fact a 2 part operation,  the first  part pressurizing the basilic vein with the hope that it matures  adequately and then transposition of this vein at approximately 6-8  weeks.  The plan to have this fistula available for use at the 36-month  time period.  We answered all questions she had.  She is aware of the  possible side effects including steal, ischemia, monomelic neuropathy,  nonmaturation of this and the need for additional procedures.  She  agrees at this point to proceed forward, and she will be scheduled for  the next week or two.     Leonides Sake, MD  Electronically Signed   BC/MEDQ  D:  11/21/2009  T:  11/24/2009  Job:  [id number]

## 2010-06-30 NOTE — Cardiovascular Report (Signed)
NAMEZYANYA, GLAZA NO.:  [id number]   MEDICAL RECORD NO.:  [id number]          PATIENT TYPE:  INP   LOCATION:  2914                         FACILITY:  [facility]   PHYSICIAN:  Nicki Guadalajara, M.D.     DATE OF BIRTH:  12-Apr-1935   DATE OF PROCEDURE:  10/10/2007  DATE OF DISCHARGE:                            CARDIAC CATHETERIZATION   Ms. Alysa Duca is a 75 year old female patient of Dr. Allyson Sabal who has a  history of tachy palpitations and presyncope.  She is felt to have sick  sinus syndrome.  She also has history of diabetes mellitus,  hypertension, and renal insufficiency.  Prior to planned permanent  pacemaker insertion, cardiac catheterization was recommended.  She was  hydrated and pretreated with sodium bicarbonate with plans not to  perform LV gram.   PROCEDURE:  After premedication with Valium 5 mg intravenously, the  patient was prepped and draped in the usual fashion.  She also received  an additional 25 mg of fentanyl for additional sedation.  The right  femoral artery was punctured anteriorly and a 5-French sheath was  inserted.  Diagnostic catheterization was done utilizing 5-French  Judkins 4 left and right coronary catheters.  A pigtail catheter was  then inserted and advanced into the left ventricle.  Left ventricular  pressures were recorded.  Due to the patient's renal insufficiency and  as outlined by Dr. Allyson Sabal, LV gram was not done.  An LV-AO pullback was  recorded.  The patient tolerated the procedure well.   HEMODYNAMIC DATA:  Central aortic pressure was 147/62, left ventricular  pressure was 147/13.   ANGIOGRAPHIC DATA:  The left main coronary was a short normal vessel  which bifurcated into an LAD and left circumflex system.   The LAD had evidence for a smooth 30% proximal stenosis before the first  septal perforating artery.  There was 20% narrowing in the first  diagonal vessel and 20% mild narrowing in the mid LAD.  The LAD extended  to  the apex.   The circumflex vessel was angiographically normal.  It gave rise to one  major trifurcating marginal vessel.   The right coronary artery had luminal irregularities in the proximal mid  and mid distal segment with narrowing of 10-20%.  The vessel supplied  the PDA and then posterolateral vessel.   As noted above, a pigtail catheter was used to cross into the left  ventricle but left ventriculography was not performed.   IMPRESSION:  Mild nonsignificantly obstructive coronary artery disease  involving the left anterior descending artery and right coronary artery  with 30% smooth narrowing in the proximal left anterior descending  artery, 20%  narrowing in the diagonal, and mid left anterior descending artery; and  luminal irregularity of 10-20% involving the proximal mid and mid distal  right coronary artery.   RECOMMENDATIONS:  Medical therapy.           ______________________________  Nicki Guadalajara, M.D.     TK/MEDQ  D:  10/10/2007  T:  10/11/2007  Job:  [id number]   cc:   Nanetta Batty,  M.D.  Antonieta Iba, MD  [facility] Eliott Nine, M.D.

## 2010-06-30 NOTE — Assessment & Plan Note (Signed)
OFFICE VISIT   [name] Mason, [name] Mason  DOB:  1936/01/06                                       11/14/2009  HQION#:[id number]   HISTORY OF PRESENT ILLNESS:  This is a 75 year old female who previously  had undergone a left brachiocephalic fistula complicated by venous  hypertension due to the presence of some venous outflow stenosis related  to permanent pacemaker.  This resulted in a ligation of her left upper  arm fistula on October 16, 2009 .  Since then, she has been seen at  clinic and the arm has progressively come down in size.  She is still  dialyzing through a right internal jugular TDC and she presents here  today for both follow-up of her ligation of her fistula and also  evaluation for new access placement.  She had a right arm vein mapping  completed today.  Since then, she has no complaints of steal in her left  hand and continues with some swelling and discomfort and sense of  fullness in that left hand but no numbness or weakness in the left hand.   Her past medical history, past surgical history, review of systems,  social history, family history and allergies are completely unchanged  from our consultation while she was in the hospital.   VITAL SIGNS:  Blood pressure 115/72, pulse 65, respirations were 12, 97%  of 2 L oxygen.  On focused examination, she had in the left arm some  continued swelling and there is no pitting edema and palpable radial and  brachial pulses.  Incisions were well-healed.  Her bilateral hands had  5/5 hand grip strength. Intrinsic and extrinsic muscle strength was  intact.  Sensation was grossly intact in bilateral hands.  There was a  palpable radial pulse in the right hand and a palpable brachial pulse.  On tourniquet examination, no vessels could be easily appreciated.   Noninvasive vascular studies:  On vein mapping, we see the right arm has  inadequate veins throughout the basilic and the cephalic system.   MEDICAL  DECISION MAKING:  This is a 75 year old female with longstanding  left internal jugular tunneled dialysis catheter placement.  She failed  her left arm access due to venous outflow stenosis related to a  permanent pacemaker placement.  I explained to the patient to avoid  recurrence, we should interrogate the central venous system on this side  with a right arm and central venogram via an IV placement in the right  arm prior to consideration of what type of graft to place.  She does not  have adequate veins, so a fistula is not an option in this patient on  the right side.  The left arm is no longer an option due to the presence  of venous outflow stenosis.  After completing the venogram and verifying  that patency of the central vasculature at this point, I would proceed  with either a forearm loop graft or a brachial axillary graft or  possibly axillary to axillary graft.  We discussed in depth with the  patient the risks of steal, ischemic monomelic neuropathy and need for  additional procedures and she understands and desires to proceed  forward.  She is tentatively scheduled for the venogram on October 6.     Leonides Sake, MD  Electronically Signed   BC/MEDQ  D:  11/14/2009  T:  11/17/2009  Job:  [id number]

## 2010-06-30 NOTE — Assessment & Plan Note (Signed)
OFFICE VISIT   EOLA, WALDREP  DOB:  [birth year]                                       10/14/2009  EAVWU#:[id number]   The patient presents today for followup of recent left brachiocephalic  fistula by Dr. Imogene Burn on 08/18.  She was an inpatient hospitalization at  that time with a prolonged chronic illness.  She presents today with  concern regarding swelling in her left arm.  Her incisions look quite  good and she does have a good thrill and bruit in her left upper arm.  She does have marked swelling in her left arm for her whole arm  including down onto the dorsum of her hand and up onto her above elbow  upper arm.  She does have a transvenous pacemaker in her subclavian vein  on the left.  I discussed the significance of this with the patient and  her family.  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  I explained  that with the increased flow through the fistula that an asymptomatic  subclavian vein occlusion can become symptomatic.  I explained the only  option would be ligation of her fistula.  She understands this and will  have this treatment on her next nondialysis day on 09/01 at [facility].  Following resolution of her swelling she will need an  eventual right arm access.  She dialyzes on Monday, Wednesday, Friday.     Larina Earthly, M.D.  Electronically Signed   TFE/MEDQ  D:  10/14/2009  T:  10/15/2009  Job:  [id number]   cc:   Leonides Sake, MD  [facility]

## 2010-06-30 NOTE — Discharge Summary (Signed)
NAMEKALANA, YUST NO.:  [id number]   MEDICAL RECORD NO.:  [id number]          PATIENT TYPE:  INP   LOCATION:  3742                         FACILITY:  [facility]   PHYSICIAN:  Antonieta Iba, MD   DATE OF BIRTH:  1936-02-02   DATE OF ADMISSION:  10/09/2007  DATE OF DISCHARGE:  10/12/2007                               DISCHARGE SUMMARY   DISCHARGE DIAGNOSES:  1. Sick sinus syndrome with 5-second pauses and symptomatic with near      syncope with need for bedrest as she could not ambulate well.  2. Minor coronary artery disease by cardiac catheterization.  3. Placement of permanent transvenous pacemaker sick sinus syndrome, a      Medtronic Adapta placed in left subclavian area by Dr. Corliss Marcus.  4. Mildly depressed ejection fraction of 50%.  5. Insulin-dependent diabetes mellitus.  6. Hypertension.  7. Chronic renal insufficiency.  8. Hyperlipidemia.  9. Peripheral neuropathy.  10.Mild anemia.   DISCHARGE CONDITION:  Improved.   PROCEDURES:  1. Combined left heart catheterization by Dr. Nicki Guadalajara on October 10, 2007.  2. Placement of permanent transvenous pacemaker by Dr. Corliss Marcus on      October 11, 2007.   DISCHARGE MEDICATIONS:  1. Allopurinol 300 mg daily.  2. Calcitriol 0.25 mcg once daily.  3. Clonidine 0.1 mg twice a day.  4. Colchicine 0.6 mg daily.  5. Drisdol 50,000 units once weekly.  6. Glyburide 3 mg twice a day.  7. Lasix 80 mg tablets, she takes 2 in the morning and 2 in the      evening for 160 b.i.d.  8. Lyrica 50 mg, she can take it up to 3 times a day, but normally      takes it once or twice a day.  Continue as before.  9. Metolazone 2.5 mg daily.  10.Metoprolol 100 mg twice a day.  11.Prilosec 20 mg daily.  12.Temazepam 15 mg daily.  13.Vytorin 10/40 once daily.  14.Potassium 20 mEq daily.  15.Lantus 43 units subcu every evening.  16.Begin Coumadin 5 mg one daily between 4 and 6 p.m., begin on October 14, 2007.  17.No aspirin.   DISCHARGE INSTRUCTIONS:  1. Low-sodium heart-healthy diabetic diet.  2. See pacemaker instruction sheet on arm movement and site care.  3. Follow with Dr. Allyson Sabal, October 24, 2007 at 11:30 a.m.  4. Follow up with Corine Shelter, the PA for Dr. Allyson Sabal on October 20, 2007 at 9:00 a.m. for pacer site check.  5. Follow up with Dr. Lynnea Ferrier in 3 months for pacemaker check.  The      office will call with date and time.  6. Coumadin Clinic on Monday, October 16, 2007 on the second floor of      Dr. Hazle Coca office at 2:50.  Your 2-D echo, nuclear study, and      previous office visit with Dr. Allyson Sabal has been canceled.   HISTORY OF PRESENT ILLNESS:  This 75 year old white married female  came  to the emergency room on October 09, 2007 by the direction of Dr. Allyson Sabal  secondary to symptomatic PAF, rapid ventricular response, and sick sinus  syndrome with pauses of up to 5.1 seconds.  She was feeling quite ill,  presyncope, and chest tightness prior to admission.  She had been seen  by Dr. Allyson Sabal on September 28, 2007.  CardioNet was placed due to tachy  palpitations.  A 2-D echo and Myoview had been ordered, but not yet  done.  Previous heart cath in September 2002 with no coronary disease,  though she does have insulin-dependent diabetes mellitus,  hyperlipidemia, and chronic renal insufficiency with creatinine at 1.7  range.  Her symptoms have increased over the last several months, but  over the weekend they actually increased and she had to pretty much stay  in the bed.  With the 5-point second pauses more than one, she was  instructed to go to the emergency room.   PAST MEDICAL HISTORY:  1. Hypertension.  2. Gout.  3. Diabetes as stated.  4. Peripheral neuropathy.  5. Bilateral cataract surgery.  6. Chronic renal insufficiency.  7. Hyperlipidemia.  8. Spondylosis.  9. Cholecystectomy.  10.Ventral hernia surgery x3.   Please note, because of her spondylosis,  she is unable to ambulate much.  She has to be in a wheelchair if she is outside of her home.   FAMILY HISTORY/SOCIAL HISTORY/REVIEW OF SYSTEMS:  See H&P.   PHYSICAL EXAMINATION AT DISCHARGE:  VITAL SIGNS:  Blood pressure 125/51,  pulse 67, respirations 19, temperature 97.8, and oxygen saturation on 2  L 98%.  HEART:  S1-S2.  Regular rate and rhythm.  Pacer site in the left  subclavian without hematoma.  There is some ecchymosis around the site.  LUNGS:  Clear without rales, rhonchi, or wheezes.  ABDOMEN:  Soft, nontender.  Positive bowel sounds.  Right groin without  hematoma.   LABORATORY DATA:  Hemoglobin on admission 13.5, hematocrit 41.2, WBC  6.9, and platelets 164.  On discharge, hemoglobin 10.4, hematocrit 31,  WBC 7.2, and platelets 128.  Chemistry:  Potassium initially was 3.3,  that was replaced and prior to discharge sodium 140, potassium 4,  chloride 99, CO2 31, BUN 35, creatinine 1.73, glucose 232, and  creatinine was essentially the same as when she was admitted to the  hospital when her BUN was 52, and creatinine 1.73.  On the heparin, she  was therapeutic.  LFTs were within normal.  Heart CK-MBs were all  negative at 58 CKs to 84 with MB of 1.5-1.9.  Troponin I 0.03 x2.  Magnesium initially was 1.2, follow up was 1.9.  Calcium 9.0.  UA was  negative and stool for blood was negative.  TSH was 3.270.   Chest x-ray on admission:  No acute cardiopulmonary abnormality.  Followup after the pacemaker revealed no pneumothorax.   EKG on day of discharge:  Sinus rhythm, normal EKG.  On October 09, 2007,  she was in sinus rhythm, cannot rule out anterior MI.   HOSPITAL COURSE:  The patient was admitted due to symptomatic sick sinus  syndrome with atrial fibrillation with rapid ventricular response and  then several 5 seconds episodes of asystole.  She was feeling very weak  and tired and near syncopal and had to go to bed over the weekend with  other complaint of dizziness.   She was admitted, placed in CCU,  monitored closely as well as serial CK-MBs were done.  She was  placed on  heparin and nitro.  She underwent cardiac cath which revealed minimal  coronary disease and she underwent permanent transvenous pacemaker on  October 11, 2007 by Dr. Corliss Marcus.  By the next morning, she was  stable, ambulating in the room which is her usual amount of activity,  any longer walking she would have to be in a wheelchair.  She was doing  quite well and was ready for discharge home.  She will follow up as an  outpatient with Dr. Allyson Sabal and eventually Dr. Lynnea Ferrier for pacer  interrogation in 3 months.  She needs to call if she has problems in the  meantime.      Darcella Gasman. Annie Paras, N.P.      Antonieta Iba, MD  Electronically Signed    LRI/MEDQ  D:  10/12/2007  T:  10/13/2007  Job:  [id number]   cc:   Nanetta Batty, M.D.  [facility] Nine, M.D.  Stefani Dama, M.D.  Francisca December, M.D.  Donia Guiles, M.D.

## 2010-06-30 NOTE — H&P (Signed)
NAMEFRIMET, DURFEE NO.:  [id number]   MEDICAL RECORD NO.:  [id number]          PATIENT TYPE:  INP   LOCATION:  6742                         FACILITY:  [facility]   PHYSICIAN:  Ramiro Harvest, MD    DATE OF BIRTH:  1935/07/23   DATE OF ADMISSION:  07/31/2008  DATE OF DISCHARGE:                              HISTORY & PHYSICAL   PRIMARY CARE PHYSICIAN:  Dr. Arvilla Market of [facility].   NEPHROLOGIST:  Dr. Camille Bal.   CARDIOLOGIST:  Dr. Gery Pray.   HISTORY OF PRESENT ILLNESS:  [name] Mason is a 75 year old white female  with history of spondylolisthesis, type 2 diabetes, hypertension,  hyperlipidemia, chronic renal insufficiency, last creatinine of 2.27 on  July 20, 2008 and history of sick sinus syndrome status post permanent  pacemaker.  She presented to the ED with a 6 to 7-week history of  posterior headache described as a soreness and the patient denies any  stiff neck.  No syncope, no photophobia, no emesis, no visual field  deficits.  No other focal neurological deficits.  The patient denies any  fevers, no chills, no syncope, no diarrhea, no constipation.  No melena,  no hematemesis.  No hematochezia.  No cough, no upper respiratory  symptoms.  The patient endorses nausea times 2 weeks and decreased oral  intake and generalized weakness and left shoulder pain.  The patient saw  PCP who ordered a head CT in May 2010 which was negative, PCP then sent  the patient to an ENT and no etiology for headache was found.  The  patient states she had minimal improvement of the headache after being  given a round of antibiotics, but since she has been off antibiotics  having these worsening recurrent headaches.  The patient tried some  Tylenol with minimal relief.  The patient was seen in the ED.  Chest x-  ray done was negative.  Abdominal x-ray was negative for any overt  obstruction.  Urinalysis was negative.  BMET with a BUN of 95 and  creatinine of 2.92.  CBC  with a white count of 12, ANC of 8.9, BNP of  50.  We were called to admit the patient for further evaluation and  management.   ALLERGIES:  CODEINE causes nausea.   PAST MEDICAL HISTORY:  1. Gastroesophageal reflux disease.  2. Sick sinus syndrome with 5-second pauses and symptomatic with near      syncope with need for bedrest status post transvenous permanent      pacemaker, Medtronic adopter placed in the left subclavian area by      Dr. Corliss Marcus.  3. Minimal coronary artery disease per cardiac cath.  4. Mildly depressed EF of 50%.  5. Insulin-dependent diabetes mellitus times 30 years.  6. Hypertension.  7. Chronic renal insufficiency.  8. Hyperlipidemia.  9. Peripheral neuropathy.  10.Gout.  11.Mild anemia.  12.Status post cholecystectomy.  13.Ventral hernia surgery x3 next.  14.Excision of right preauricular mask with intermediate closure per      Dr. Ezzard Standing May 31, 2006.  15.Obesity.  16.History of bilateral lower  extremity edema.  17.History of fistula in ano.  18.Status post I&D of ischiorectal abscess per Dr. Abbey Chatters May      2003.  19.Spondylolisthesis.   HOME MEDICATIONS1:  1. Coumadin 2 mg on Sundays and Wednesdays 1 mg on Mondays, Tuesdays,      Thursdays, Fridays and Saturdays.  2. Diltiazem 240 mg p.o. daily.  3. Metoprolol 100 mg p.o. b.i.d.  4. Flecainide 100 mg p.o. b.i.d.  5. Lasix 160 mg p.o. b.i.d.  6. Potassium chloride 20 mEq p.o. b.i.d.  7. Metolazone 2.5 mg p.o. daily.  8. Colchicine 0.6 mg p.o. daily.  9. Allopurinol 300 mg p.o. daily.  10.Levemir 46 units subcu daily.  11.Glimepiride 2 mg p.o. b.i.d.  12.Temazepam as needed.  13.Lyrica 50 mg p.o. daily and t.i.d. p.r.n.Marland Kitchen  14.Clonidine 0.1 mg p.o. b.i.d.  15.Prilosec 20 mg p.o. daily.  16.Vitamin D 50,000 I units q. weekly.  17.Calcitriol 0.5 mg on Mondays, Wednesdays and Fridays and 0.25 mg      all other days.  18.Fish oil 1200 mg 3 tablets p.o. daily.  19.Tylenol 500 mg as  needed.   SOCIAL HISTORY:  The patient is married, no tobacco use.  No alcohol  use.  No IV drug use.  The patient occasionally uses a wheelchair when  she has to walk or ambulate long distances secondary to spondylosis.   FAMILY HISTORY:  Noncontributory.   PHYSICAL EXAM:  VITAL SIGNS:  Temperature 97.3, blood pressure 127/70,  pulse 63, respirations 20, satting 99%.  GENERAL:  Patient in no apparent distress.  HEENT: Normocephalic, atraumatic.  Pupils equal, round and reactive  light and accommodation.  Extraocular movements intact.  Oropharynx is  clear.  No lesions, no exudate.  NECK: Supple.  No lymphadenopathy.  Dry mucous membranes.  RESPIRATORY:  Lungs are clear to auscultation bilaterally.  No wheezes,  no crackles or rhonchi.  CARDIOVASCULAR:  Regular rhythm.  No murmurs, rubs or gallops.  ABDOMEN: Soft, nontender, nondistended.  Positive bowel sounds.  EXTREMITIES: No clubbing, cyanosis or edema.  NEUROLOGICAL:  The patient is alert and oriented x3.  Cranial nerves II-  XII are grossly intact.  No focal deficits.   LABS:  BNP of 50.  CBC: White count of 12, hemoglobin 12.3, hematocrit  37.9, platelets of 341, ANC of 8.9, sodium 135, potassium 3.9, chloride  88, bicarb 32, BUN 95, creatinine 2.92, glucose of 171, calcium of 9.5.  Chest X-Ray: No acute cardiopulmonary disease.  Abdominal film with  minimal gaseous distention of the large and small-bowel, no evidence of  overt obstruction per x-ray.   ASSESSMENT AND PLAN:  [name] Mason is a 75 year old female who  presents to the emergency department with a 6 to 7-week history of  posterior headaches, nausea, and found to be dehydrated and in acute on  chronic renal failure.  #1.  Headaches:  Likely secondary to tension headaches.  We will admit  the patient.  We will repeat a head CT, place on Ultram and Reglan for  headache, supportive care.  May need a Neurology consult in the morning  for further evaluation and  recommendations.  Unable to obtain an MRI at  this time secondary to history of pacemaker.  #2.  Dehydration:  Secondary to decreased oral intake in the setting of  diuretics.  We will check a hepatic panel.  Check orthostatics.  Hydrate  with IV fluid, hold diuretics for now and follow.  #3.  Acute on chronic renal insufficiency:  Last creatinine of 2.27 on  July 20, 2008, likely prerenal azotemia secondary to problem number #2 in  the setting of diuretic use.  Check a fractional excretion of sodium.  Gentle IV hydration.  Hold diuretics for now and follow per emergency  department.  Renal to see in the morning.  #4.  Leukocytosis, questionable etiology:  Likely reactive leukocytosis.  The patient is afebrile.  Urinalysis is negative.  Chest x-ray is  negative.  We will follow.  #5.  Nausea:  Likely secondary to headache versus GI related.  The  patient may have delayed gastric emptying as she has been a diabetic.  Abdominal x-ray shows no overt obstruction.  We will check a lipase  level.  We will repeat abdominal series in the morning,  check a gastric  emptying study and symptomatic care.  #6.  Type 2 diabetes:  Lantus scale sliding scale insulin and continue  Lyrica for peripheral neuropathy.  #7.  Hypertension:  Continue clonidine, beta-blocker and Diltiazem.  #8.  Sick sinus syndrome status post permanent pacemaker:  Stable.  Continue Diltiazem, flecainide and beta-blocker.  #9.  Gout:  Colchicine and Allopurinol.  #10.  Prophylaxis:  Protonix for gastrointestinal prophylaxis.  Epidural  heparin for deep venous thrombus prophylaxis.  #11.  Hyperlipidemia:  Continue fish oil.  #12.  Gastroesophageal reflux disease:  Protonix.    It has been a pleasure taking care of Ms. Elaysha Kuklinski.      Ramiro Harvest, MD  Electronically Signed     Ramiro Harvest, MD  Electronically Signed    DT/MEDQ  D:  07/31/2008  T:  07/31/2008  Job:  [id number]   cc:   Donia Guiles, M.D.   [facility] Nine, M.D.  Nanetta Batty, M.D.

## 2010-06-30 NOTE — Procedures (Signed)
CEPHALIC VEIN MAPPING   INDICATION:  Possible preop for end-stage renal disease.   HISTORY:   EXAM:  Right upper extremity vein mapping.   The right cephalic vein is compressible.   The right basilic vein is compressible.   See attached work sheet for all measurements.   IMPRESSION:  Patent right cephalic and basilic veins with diameter  measurements attached.   ___________________________________________  Leonides Sake, MD   EM/MEDQ  D:  11/14/2009  T:  11/14/2009  Job:  [id number]

## 2010-07-03 NOTE — Discharge Summary (Signed)
[name] Mason, [name] Mason                          ACCOUNT NO.:  [id number]   MEDICAL RECORD NO.:  [id number]                   PATIENT TYPE:  INP   LOCATION:  0362                                 FACILITY:  [facility]   PHYSICIAN:  [name]. Polite, M.D.              DATE OF BIRTH:  06-08-1935   DATE OF ADMISSION:  12/19/2002  DATE OF DISCHARGE:  12/21/2002                                 DISCHARGE SUMMARY   DISCHARGE DIAGNOSES:  1. Hypoglycemia, resolved, secondary to medications and decreased p.o.     intake plus/minus worsening renal function.  2. Chronic renal insufficiency, creatinine 2.0 on admission and 1.8 at     discharge.  3. Hypertension.  4. Obesity.  5. Lower extremity edema.  6. Recent urinary tract infection/bronchitis treated with quinolone on an     outpatient basis.  7. Elevated cholesterol.   DISCHARGE MEDICATIONS:  1. Augmentin 500 mg b.i.d. x7 days.  2. Patient is asked to resume home medications with the exception of     Metformin secondary to worsening renal function.  Home medications include:  1. Lipitor 40 mg every day.  2. Clonidine 0.1 two tabs in the morning and two tabs in the evening.  3. Avandia 4 mg b.i.d.  4. Lasix 40 mg daily.  5. Glyburide daily.  6. Metformin daily.  7. Metoprolol 100 mg p.o. b.i.d.  8. Benicar 40 mg p.o. every day.   STUDIES:  Patient had a chest x-ray that showed cardiomegaly.   UA negative.  BMET significant for creatinine of 2.0, creatinine of 1.8 at  discharge.  CBG at discharge 200.   CONSULTANTS:  None.   HISTORY OF PRESENT ILLNESS:  This is a 75 year old female with above medical  problems who presented to the ED feeling weak, shaky at home.  CBG was  checked and it was 37.  Patient presented to the ED by EMS with documented  CBG of 37.  Patient was treated with amp of D-50 in the ED with recurrent  hypoglycemia.  Admission was deemed necessary for further evaluation and  treatment.   PAST MEDICAL HISTORY:  As  stated above.   MEDICATIONS:  As stated above.   HOSPITAL COURSE:  Problem #1:  The patient was admitted to a medicine  telemetry bed for evaluation and treatment of recurrent hypoglycemia.  The  etiology of this is multifactorial, most likely secondary to her baseline  medications compounded by decreased p.o. intake and also plus/minus from  effect of quinolone, which can alter blood sugars as well as the patient's  worsened renal function that may have made her medications hang around  longer than expected.  Patient was treated with D-10 where after that there  were no more episodes of hypoglycemia.  The patient was ultimately titrated  off D-10 and maintained normal blood sugars greater than 18 hours.  Patient  was tolerating p.o. intake, ambulating,  without any complaints.  At this  time, the patient is deemed stable for discharge.  She will resume her home  meds.  She has been asked to hold her Glucophage until further evaluated by  her outpatient M.D. as her renal function is marginal with an EF of  approximately 25%.   Problem #2:  Partially treated bronchitis.  During this hospitalization,  patient was not febrile and without elevation in WBC count.  She did  complain of sinus pressure, congestion, and postnasal drip.  Patient will  continue on p.o. antibiotics in the form of Augmentin for her  rhinitis/sinusitis symptoms.  This will be followed up on an outpatient  basis.  As the patient is afebrile and without elevation of WBC count, she  will also be given a flu vaccine prior to discharge.   Problem #3:  Chronic renal insufficiency.  Patient had an admission  creatinine of 2.0 and creatinine at discharge of 1.8.  Patient expressed  knowledge about marginal renal function in the past.  She has been cautioned  against nephrotoxic medications and she will have this further followed up  on an outpatient basis with her primary M.D.   Patient has several other chronic medical  problems which she will continue  her current outpatient medications.  At this time, she is stable for  discharge.                                               [name]. Polite, M.D.    RDP/MEDQ  D:  12/21/2002  T:  12/21/2002  Job:  [id number]   cc:   Donia Guiles, M.D.  [address] [place]  [place] [address]  Fax: [phone]

## 2010-07-03 NOTE — Op Note (Signed)
[name] Mason, [name] Mason NO.:  [id number]   MEDICAL RECORD NO.:  [id number]          PATIENT TYPE:  AMB   LOCATION:  ENDO                         FACILITY:  [facility]   PHYSICIAN:  Danise Edge, M.D.   DATE OF BIRTH:  [birth year]   DATE OF PROCEDURE:  01/06/2004  DATE OF DISCHARGE:                                 OPERATIVE REPORT   PROCEDURE:  Esophagogastroduodenoscopy.   INDICATIONS FOR PROCEDURE:  [name] is a 75 year old female born Nov 01, 1935.  [name] has unexplained epigastric pain.   ENDOSCOPIST:  Danise Edge, M.D.   PREMEDICATION:  Versed 5 mg, Demerol 50 mg.   PROCEDURE:  After obtaining informed consent, [name] was placed in the  left lateral decubitus position.  I administered intravenous Demerol and  intravenous Versed to achieve conscious sedation for the procedure.  Patient's blood pressure, oxygen saturation, and cardiac rhythm were  monitored throughout the procedure and documented in the medical record.   The Olympus gastroscope was passed through the posterior hypopharynx and the  proximal esophagus without difficulty.  The hypopharynx, larynx, and vocal  cords appeared normal.   ESOPHAGOSCOPY:  The proximal, mid, and lower segments of the esophageal  mucosa appear normal.   GASTROSCOPY:  Retroflexed view of the gastric cardia and fundus was normal.  The gastric body, antrum, and pylorus appeared normal.   DUODENOSCOPY:  The duodenal bulb and descending duodenum appeared normal.   BIOPSY:  A biopsy was taken from the distal gastric antrum for CLO test to  rule out Helicobacter pylori and antral gastritis.   ASSESSMENT:  Normal esophagogastroduodenoscopy.  CLO test to rule out  Helicobacter pylori and antral gastritis pending.   RECOMMENDATIONS:  [name] will probably require a CT scan of her abdomen  and pelvis.  If her CT scan is normal, I will schedule her for a nuclear  medicine solid-phase gastric-emptying  study.      MJ/MEDQ  D:  01/06/2004  T:  01/06/2004  Job:  [id number]   cc:   Donia Guiles, M.D.  [address] [place]  [place] [address]  Fax: [phone]

## 2010-07-03 NOTE — H&P (Signed)
NAMEDAPHINE, LOCH                          ACCOUNT NO.:  [id number]   MEDICAL RECORD NO.:  [id number]                   PATIENT TYPE:  INP   LOCATION:  0101                                 [facility]ACILITY:  [facility]   PHYSICIAN:  Jackie Plum, M.D.             DATE OF BIRTH:  01-11-1936   DATE OF ADMISSION:  12/19/2002  DATE OF DISCHARGE:                                HISTORY & PHYSICAL   PROBLEM LIST:  1. Recurrent hypoglycemia secondary to a combination of low oral intake     related to bronchitis/urinary tract infection and adverse drug reaction     between glyburide and Tequin.  2. Normocytic anemia.  3. Renal insufficiency.  4. Diabetes mellitus.  5. Hypertension.  6. Obesity.  7. Bilateral lower extremity edema without any significant cardiopulmonary     symptomatology before onset of acute bronchitis, likely secondary to side     effect of Avandia.  8. Prior history of chest pain with normal coronaries on a cardiac     catheterization in September 2002.  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   CHIEF COMPLAINT:  Hypoglycemia.   HISTORY OF PRESENT ILLNESS:  The patient is a 75 year old lady with history  of diabetes mellitus amongst others, please see above, who was brought in by  EMS on account of hypoglycemia.  This morning patient noted that she was  having hypoglycemic symptoms including diaphoresis, nervousness, shakiness,  and she checked a blood glucose which was at 37 mg/dl.  She therefore called  EMS who brought patient to ED for further evaluation.  EMS documentation  indicated the patient was found to be warm and diaphoretic.  She complained  of some generalized weakness.  Her CBG was 37 mg/dL.  She was  hemodynamically stable otherwise and she received D50 two amps prior to  transportation to the ED.  At the emergency room patient received four amps  of D50 at various times and we are called to admit patient on account of  recurrent hypoglycemic episodes despite the  IV D50 in the ED.   Patient narrates that she had been in her usual state of health until about  a week ago when she started having some cough with chest congestion.  She  saw her doctor and she was diagnosed with upper respiratory tract infection  as well as urinary tract infection yesterday and started on Tequin.  She  took her first dose of Tequin last night and did not take any Tequin today.  She also indicated that she has been having some nausea with epigastric pain  and anorexia and her p.o. intake had been very limited on account of these  symptoms.  Patient denies any history of fever, chills, sore throat, cough,  sputum production, chest pain, shortness of breath, abdominal pain,  vomiting, diarrhea or constipation, bright red blood per rectum or any  melenic stools, back pain, headaches, extremity swelling but  admits to  frequency of micturition without dysuria.   PAST MEDICAL HISTORY:  Please see problem list above.   ADMISSION MEDICATION:  1. Lipitor 40 mg p.o. once daily.  2. Clonidine 0.1 mg two tabs every morning and two tabs every evening.  3. Avandia 4 mg p.o. b.i.d.  4. Glyburide/metformin.  5. Lasix 40 mg once daily.  6. Metoprolol 100 mg p.o. b.i.d.  7. Benicar 40 mg p.o. once daily.   ALLERGIES:  There is no history of any drug allergies.   FAMILY HISTORY:  Positive for diabetes and hypertension.   SOCIAL HISTORY:  She does not drink alcohol nor smoke cigarettes.   REVIEW OF SYSTEMS:  Significant positive/negative are as in HPI but  otherwise review of systems was unremarkable.   PHYSICAL EXAMINATION:  VITAL SIGNS:  BP 139/45, pulse 60, temperature 97,  respiratory rate 20, O2 saturation 97% on room air.  GENERAL:  She was not in acute cardiopulmonary distress, she was not in  painful distress, she was not toxic looking.  HEENT:  Normocephalic, atraumatic.  Pupils were equal, round, reactive to  light.  Extraocular movements were intact.  Oropharynx was  moist without any  exudates or erythema in her pharynx.  TMs were unremarkable.  NECK:  Supple; no JVD, no thyromegaly.  CHEST:  She had vestibular breath sounds with few rhonchi without any  wheezes.  ABDOMEN:  Obese.  She had umbilical hernia which was easily reducible, it  was nontender.  Bowel sounds were present, they were normoactive.  CARDIAC:  Unremarkable.  SKIN:  Warm, dry, with normal color.  EXTREMITIES:  She had 2+ pitting bipedal edema; no clubbing, no cyanosis.   LABORATORY DATA:  WBC 6.5, hemoglobin 10.4, hematocrit 30.2, platelet count  258.  Sodium 137, potassium 4.1, chloride 105, CO2 28, glucose 62, BUN 36,  creatinine 2, calcium 8.7.   ASSESSMENT/PLAN:  I believe that this patient's hypoglycemic episode is a  combination of low oral intake which is related to her urinary tract  infection and bronchitis.  Also patient was started on Tequin last night and  Tequin has the propensity to interact with glyburide to cause hypoglycemia.  I believe that this patient's symptoms are due to functional element.  The  plan of care is to admit patient for observation.  We will continue her on  D10 and discontinue Tequin.  We will substitute that for Augmentin which  will be adequate for urinary as well as pulmonary coverage.  Patient's  antidiabetic medications will be held for now.   Patient has bilateral lower extremity edema.  She has risk factors for  coronary artery disease and possible congestive heart failure.  However,  clinically there is no evidence of this.  I believe that patient's lower  extremity edema may be related to Avandia which has this side effect.  Patient had a normal cardiac catheterization as mentioned above in 2000.  Actually patient has anemia of 10.4 and 30.2 for hemoglobin and hematocrit,  respectively.  This may be related to her chronic renal insufficiency.  No further workup will be done and if any workup has not been done  previously we  recommend that her primary care physician will at the  outpatient level look into performing complete anemia workup for this lady.  I spent a good amount of time to discuss with patient the cause of her  symptoms and she expressed understanding.  Jackie Plum, M.D.    GO/MEDQ  D:  12/19/2002  T:  12/19/2002  Job:  [id number]   cc:   Angelena Sole, M.D. [facility]   Windy Fast D. Polite, M.D.  [address]  [place], [place] [address]  Fax: [phone]

## 2010-07-03 NOTE — Op Note (Signed)
[name], [name] NO.:  [id number]   MEDICAL RECORD NO.:  [id number]          PATIENT TYPE:  AMB   LOCATION:  DSC                          FACILITY:  [facility]   PHYSICIAN:  Christopher E. Ezzard Standing, M.D.DATE OF BIRTH:  [birth year]   DATE OF PROCEDURE:  05/31/2006  DATE OF DISCHARGE:                               OPERATIVE REPORT   PREOPERATIVE DIAGNOSIS:  Right preauricular mass/cyst.   POSTOPERATIVE DIAGNOSIS:  Right preauricular mass/cyst.   OPERATIONS:  Excision of right preauricular mass with intermediate  closure.  (1.5 to 2 cm).   SURGEON:  Kristine Garbe. Ezzard Standing, M.D.   ANESTHESIA:  Local 1% Xylocaine with epinephrine.   COMPLICATIONS:  None.   BRIEF CLINICAL NOTE:  [name] Mason is a 75 year old female who had  noticed a nodule in front of her right ear now for about a year.  On  examination this is consistent with a probable sebaceous cyst, could  represent a subcutaneous mass.  She has normal facial nerve function  this does not appeared to be extending to the parotid gland.  She is  taken to operating at this time for local excision.   DESCRIPTION OF PROCEDURE:  The patient is right ear was prepped with  Betadine solution.  The area was then injected with 3 mL Xylocaine with  epinephrine for local anesthetic.  The mass was elliptically excised.  It was adherent to the skin.  It was superficial to the parotid fascia.  The specimen was excised and sent to pathology.  Hemostasis was obtained  with eye cautery and the defect was closed with 4-0 Vicryl subcutaneous  suture and 6-0 nylon reapproximate skin edges.  [name] Mason tolerated this  well and was subsequently discharged home on Tylenol p.r.n. pain.  Dressing was applied.  She was instructed to remove the dressing  tomorrow and will follow-up in my office in one week to have sutures  removed and review pathology.           ______________________________  Kristine Garbe Ezzard Standing, M.D.     CEN/MEDQ   D:  05/31/2006  T:  05/31/2006  Job:  [id number]   cc:   Donia Guiles, M.D.

## 2010-07-03 NOTE — Cardiovascular Report (Signed)
[place]  Patient:    [name] Mason, [name] Mason Visit Number: [id number] MRN: [id number]          Service Type: CAT Location: [facility] 2867 01 Attending Physician:  Berry, Jonathan [place] Dictated by:   Runell Gess, M.D. Proc. Date: 11/04/00 Admit Date:  11/04/2000   CC:         Second Floor [place] [facility]  Desma Maxim, M.D.  [facility], [address], [place] [address]   Cardiac Catheterization  INDICATIONS:  [name] Mason is a 75 year old moderately overweight black female with history of hypertension, hyperlipidemia, non-insulin-requiring diabetes mellitus, family history of heart disease.  She has complained of chest tightness and shortness of breath.  She presents now for diagnostic coronary arteriography to rule out ischemic etiology.  PROCEDURE:  Diagnostic coronary arteriography  CARDIOLOGIST:  Runell Gess, M.D.  DESCRIPTION OF PROCEDURE:  The patient was brought to second floor [place] [facility] lab in the postabsorptive state.  She was premedicated with p.o. Valium and IV Versed.  Her right groin was prepped and shaved in the usual sterile fashion.  Xylocaine 1% was used for local anesthesia.  A 6-French sheath was inserted into the right femoral artery using standard Seldinger technique.  Then, 6-French right and left Judkins diagnostic catheters along with a 6-French pigtail catheter were used for selective coronary angiography, left ventriculography, and distal abdominal aortography. Omnipaque dye was used for the entirety of the case.  Retrograde aorta, left ventricular, and pullback pressures were recorded.  HEMODYNAMICS: 1. Aortic systolic pressure 169, diastolic pressure 71. 2. Left ventricular systolic pressure 169, diastolic pressure 26.  SELECTIVE CORONARY ANGIOGRAPHY: 1. Left main normal. 2. LAD normal. 3. Left circumflex normal. 5. Right  coronary artery was dominant and normal.  LEFT VENTRICULOGRAPHY:  RAO left ventriculogram was performed using 20 cc of Omnipaque dye at 20 cc/second.  The overall LV EF was estimated at greater than 60% without focal wall motion abnormalities.  DISTAL ABDOMINAL AORTOGRAPHY:  Distal abdominal aortogram was performed using 25 followed by 30 cc of Omnipaque dye at 20 cc per second.  The renal arteries were widely patent.  The infrarenal abdominal aorta and iliac bifurcation were free of significant atherosclerotic disease.  IMPRESSION:  [name] Mason has essentially normal coronary arteries, normal left ventricular function, and distal abdominal aorta including renal arteries.  I believe that her chest pain is noncardiac, and shortness of breath is most likely related to her weight and/or deconditioning (multifactorial). The sheaths were removed and pressure applied to the groin to achieve hemostasis.  The patient left the lab in stable condition.  She will be discharged home later today as an outpatient, and we will see him back in the office in three weeks in followup. Dictated by:   Runell Gess, M.D. Attending Physician:  Berry, Jonathan [place] DD:  11/04/00 TD:  11/04/00 Job: [id number]

## 2010-07-03 NOTE — Op Note (Signed)
NAMELYRICK, WORLAND                          ACCOUNT NO.:  [id number]   MEDICAL RECORD NO.:  [id number]                   PATIENT TYPE:  AMB   LOCATION:  DAY                                  FACILITY:  [facility]   PHYSICIAN:  Adolph Pollack, M.D.            DATE OF BIRTH:  Jun 15, 1935   DATE OF PROCEDURE:  03/06/2002  DATE OF DISCHARGE:                                 OPERATIVE REPORT   PREOPERATIVE DIAGNOSIS:  Fistula in ano.   POSTOPERATIVE DIAGNOSIS:  Fistula in ano.   OPERATION PERFORMED:  Examination under anesthesia and placement of seton.   SURGEON:  Adolph Pollack, M.D.   ANESTHESIA:  General.   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  On the last  drainage procedure I performed in the office, I could feel a papilla and  what appeared to be a small defect in the anoderm consistent with the  fistula.  She now presents for the above procedure.  The procedure and the  risks were explained to her preoperatively.   DESCRIPTION OF PROCEDURE:  She was brought to the operating room and placed  supine on the operating table and a general anesthetic was administered.  She was then placed in the lithotomy position.  The perianal area was  sterilely prepped and draped.  The previous abscess drainage site was noted  and was healed but there appeared to be a small pocket deep to it.  On  digital rectal exam, I felt an indurated area with a small papilla noted.  An anoscope was inserted and I noted an inflamed area in the left lateral  position which appeared to be a punctate hole. I made a small incision  through the previous abscess drainage scar and noted a pocket present.  I  then passed a small lacrimal duct probe into this pocket and was able to  find a small punctate hole and bring it out through the anus.  I then passed  a 0 silk suture through the tunnel and then tied it  down tightly.  I did not  feel that I could do a fistulotomy because this was a submuscular fistula  and I did not want to risk the loss of anal continence.  I did leave a long  tag on the suture.  I then anesthetized the area with Marcaine by way of  injection.  A sterile dressing was applied.  The patient tolerated the  procedure well without any apparent complications.  She was taken to  recovery  room in satisfactory condition.  Postoperatively she will be sent home and I  will see her in the office and adjust the seton weekly.  Adolph Pollack, M.D.    Kari Baars  D:  03/06/2002  T:  03/06/2002  Job:  [id number]   cc:   Donia Guiles, M.D.  [address] [place]  [place] [address]  Fax: [phone]

## 2010-07-03 NOTE — Op Note (Signed)
[facility]  Patient:    [name] Mason, [name] Mason Visit Number: [id number] MRN: [id number]          Service Type: DSU Location: DAY Attending Physician:  Arlis Porta Dictated by:   Adolph Pollack, M.D. Proc. Date: 06/27/01 Admit Date:  06/27/2001 Discharge Date: 06/27/2001   CC:         Desma Maxim, M.D.   Operative Report  PREOPERATIVE DIAGNOSIS:  Perirectal abscess.  POSTOPERATIVE DIAGNOSIS:  Ischiorectal abscess.  PROCEDURE:  Incision and drainage of ischiorectal abscess.  SURGEON:  Adolph Pollack, M.D.  ANESTHESIA:  General.  DRAINS:  1/4 inch Penrose.  INDICATIONS FOR PROCEDURE:  This 75 year old female has been having progressively increasing pain in the left perianal area. It has become more severe in the past day or two and she has begun feeling poorly. She presented to the office after being sent over by Dr. Arvilla Market and was noted to have a tender indurated area. She is now brought to the operating room for drainage of abscess. She has a history of a previous perirectal abscess. The procedure and the risks particularly bleeding and anesthesia were explained to her preoperatively.  TECHNIQUE:  She was placed supine on the operating table and a general anesthetic was administered. She was then placed in the lithotomy position. The perianal area was sterilely prepped and draped. In the left posterolateral position, a firm indurated area was noted. I inserted the needle and withdrew pus. I withdrew the needle and made a full-thickness triangular shape incision and removed a plug of tissue. Purulent drainage was evacuated. I then digitally broke up loculations and drained more purulent fluid. Next, I put an anoscope into the anus and visualized the left posterolateral area. I saw no evidence of a fistulous connection. I placed a 1/4 inch Penrose drain deep in the ischiorectal space. I anchored it to the skin with a 3-0  chromic suture. A bulky dressing was then applied.  She tolerated the procedure well without any apparent complications. She was taken to the recovery room in satisfactory condition. Dictated by:   Adolph Pollack, M.D. Attending Physician:  Arlis Porta DD:  06/27/01 TD:  06/29/01 Job: [id number]

## 2010-09-26 IMAGING — CR DG CHEST 1V PORT
1 series · 1 of 1 positions shown · non-contrast
Comparison: 1 day prior

CLINICAL DATA: Respiratory failure.  Hyperkalemia.  Follow up of
endotracheal tube placement. Attempted line placement.

PORTABLE CHEST - 1 VIEW

[AP]
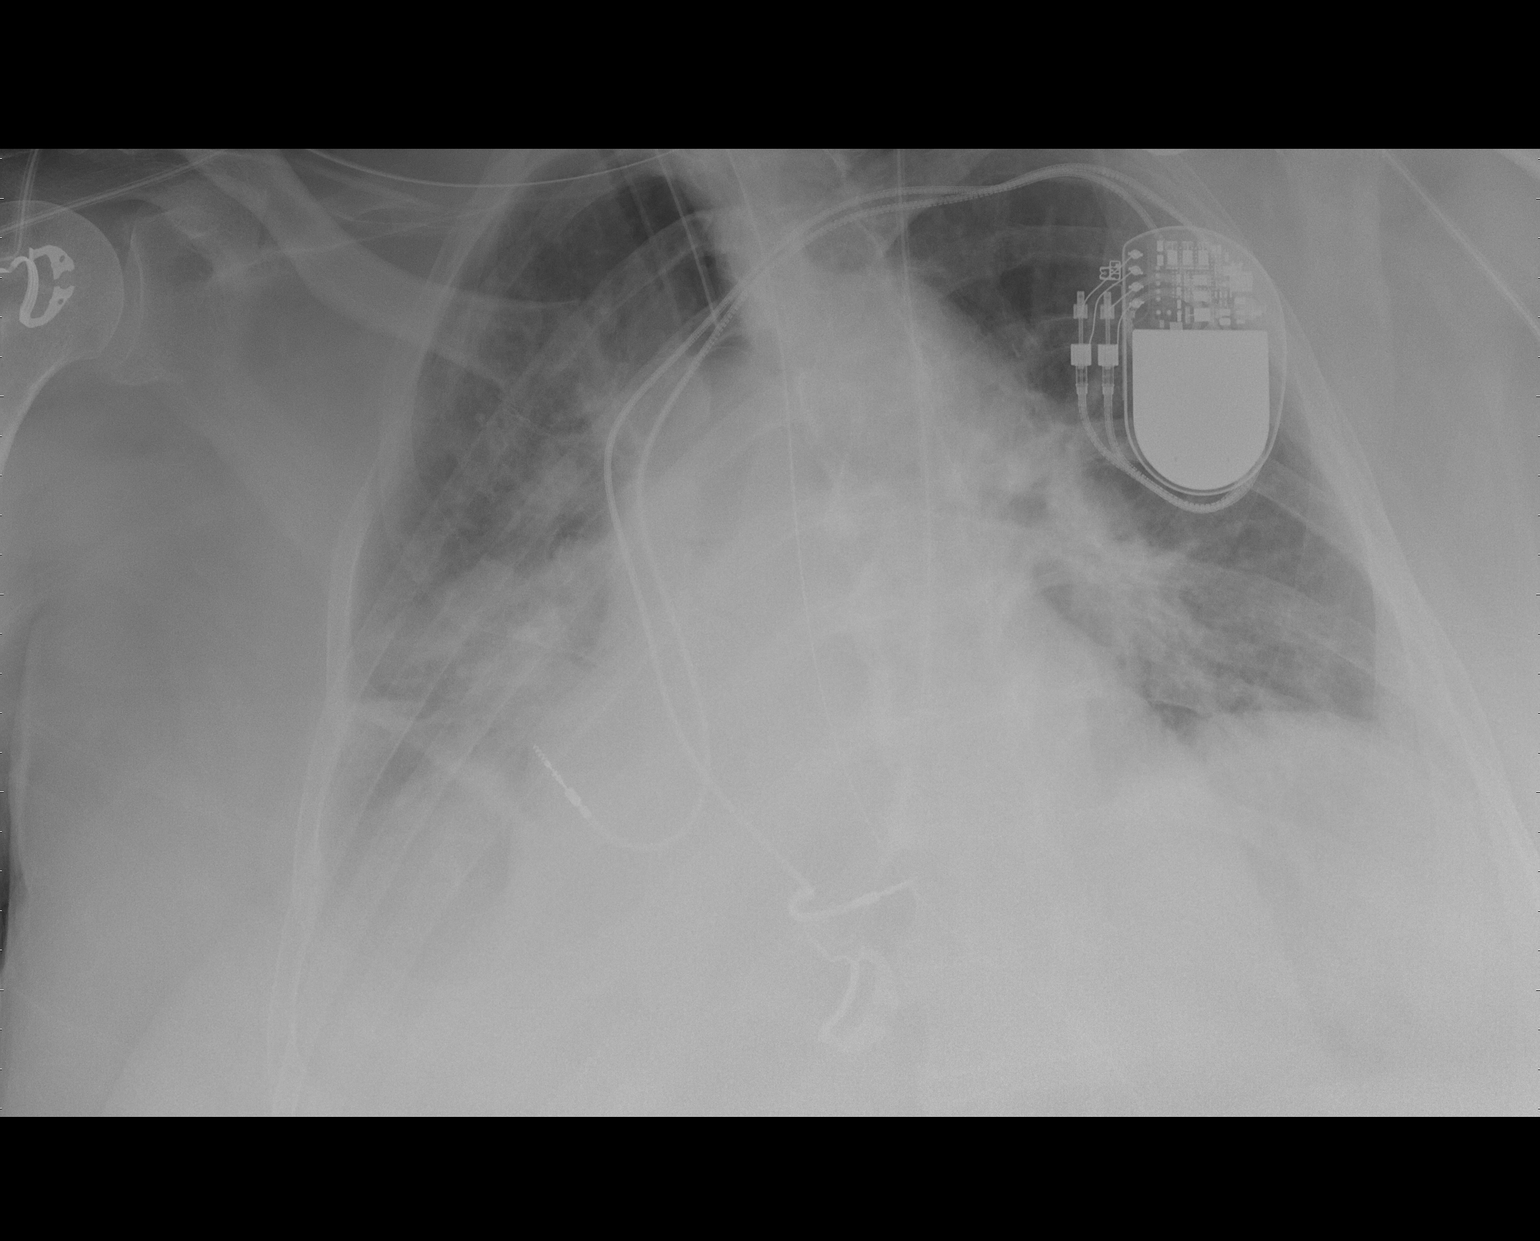

[1 of 1 positions shown; findings below may reference images not displayed]

FINDINGS: Patient rotated to the right.  Endotracheal tube remains
the level of the clavicles.  Nasogastric extends beyond the
inferior aspect of the film.  Dual lead pacer.

Mild cardiomegaly.  Layering small right pleural effusion is
unchanged. No pneumothorax.  Slight increase in interstitial edema.
Persistent right lower lobe airspace disease.  Developing mild left
base atelectasis.
IMPRESSION: Slight worsening in the congestive heart failure with persistent
right pleural effusion and right greater than left bibasilar
airspace disease.

No pneumothorax.

## 2010-09-26 IMAGING — US US ABDOMEN PORT
1 series · 13 of 25 positions shown · non-contrast
Comparison: Unenhanced CT abdomen 08/27/2005.

CLINICAL DATA: Elevated liver function tests.

COMPLETE ABDOMINAL ULTRASOUND 06/28/2009:

[Series 1: us abdomen port · 0.39mm/px · 13 of 48 slices shown]
[im 1/48]
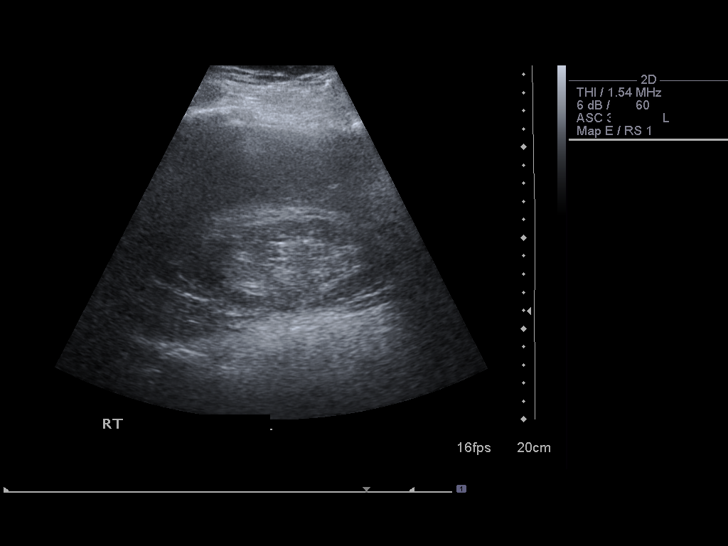
[im 4/48]
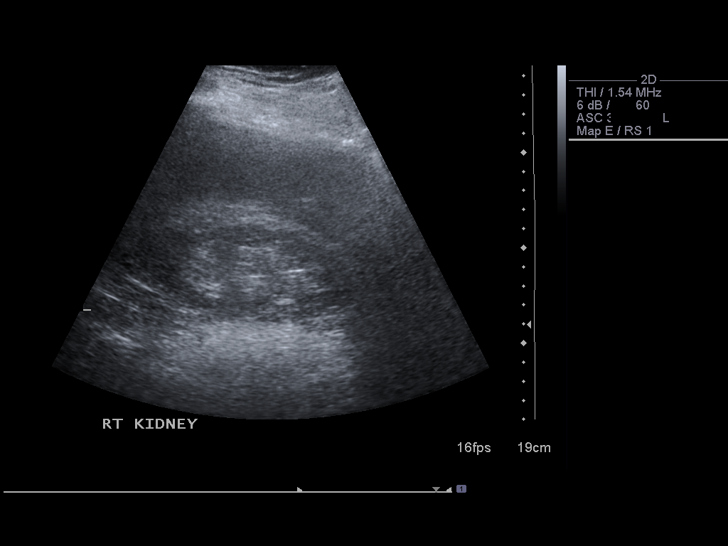
[im 8/48]
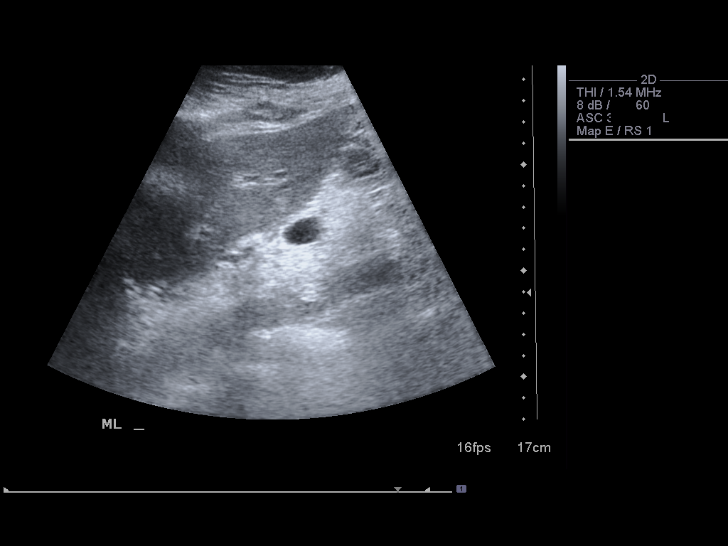
[im 12/48]
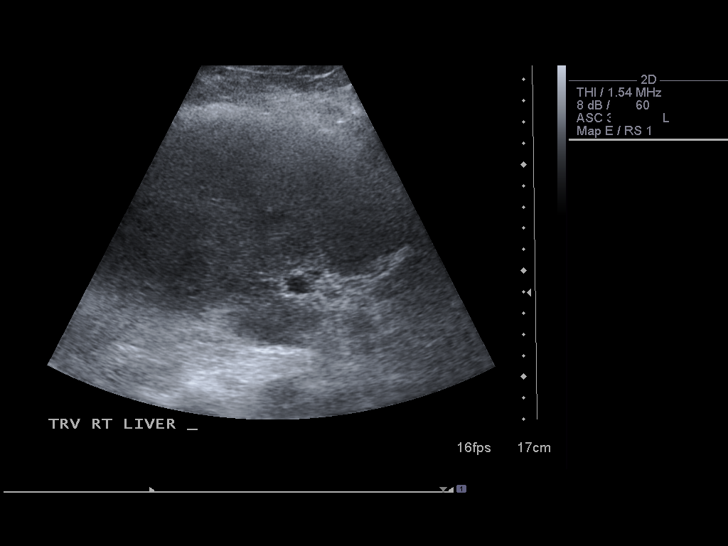
[im 16/48]
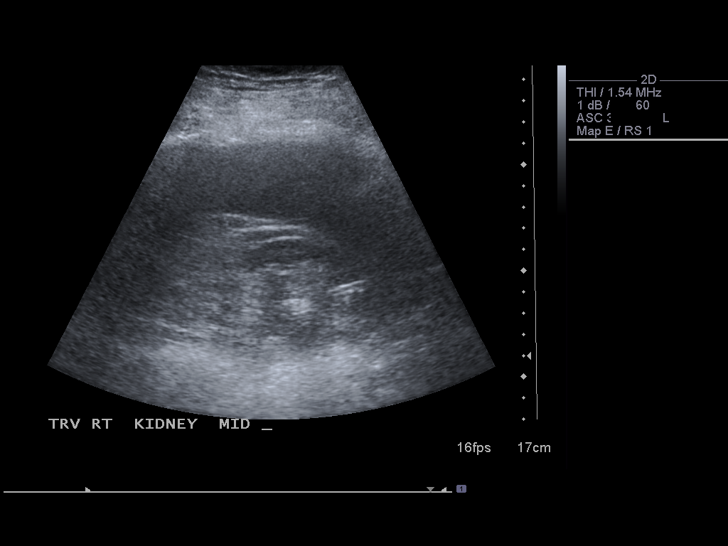
[im 20/48]
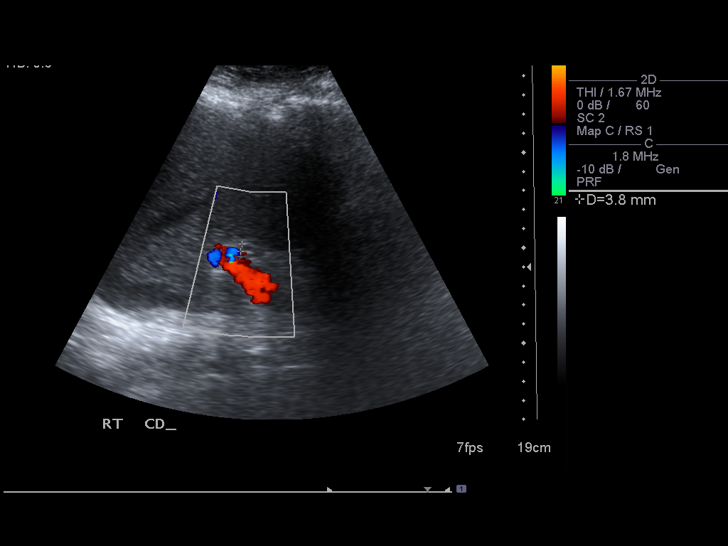
[im 24/48]
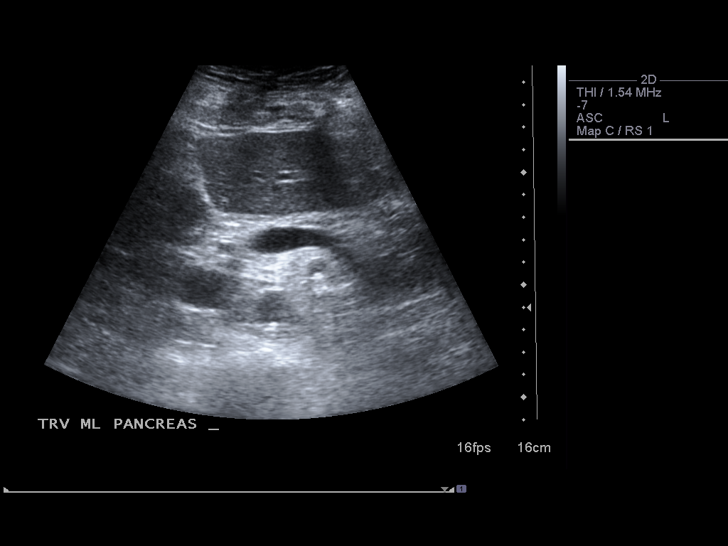
[im 28/48]
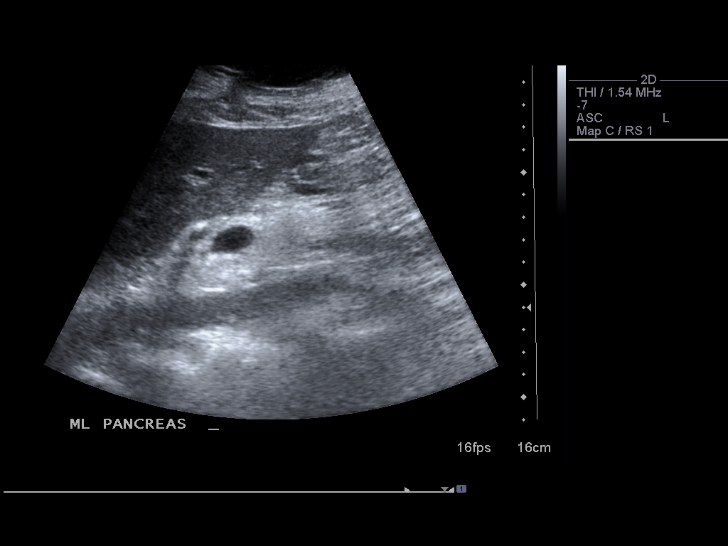
[im 32/48]
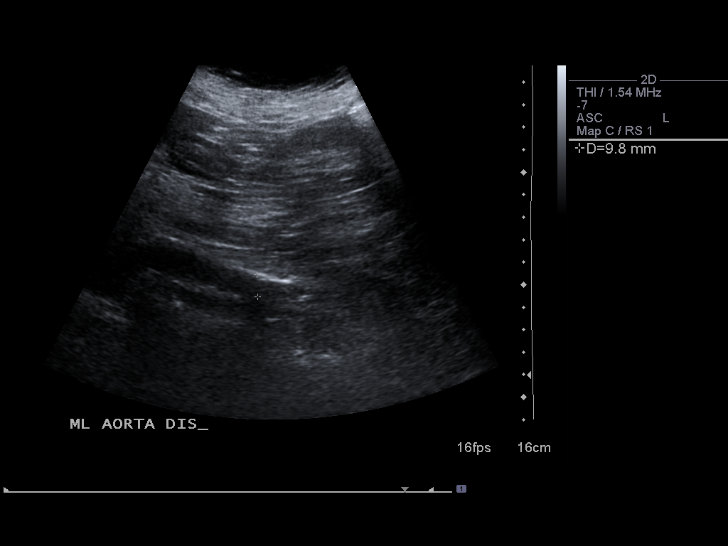
[im 36/48]
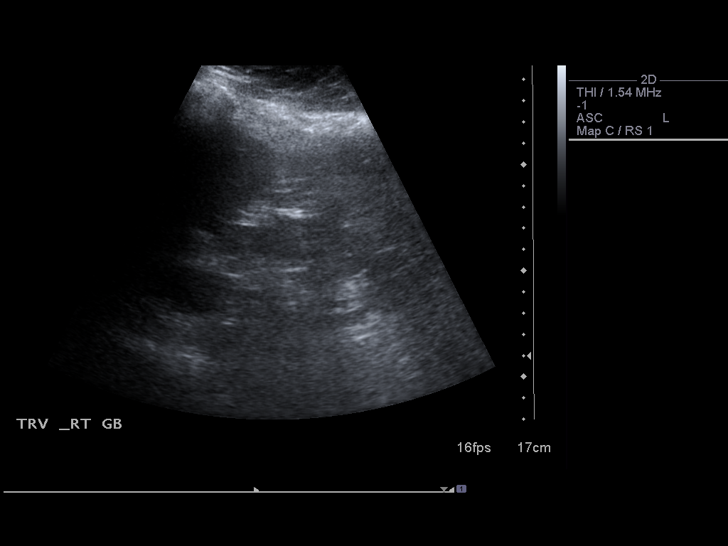
[im 40/48]
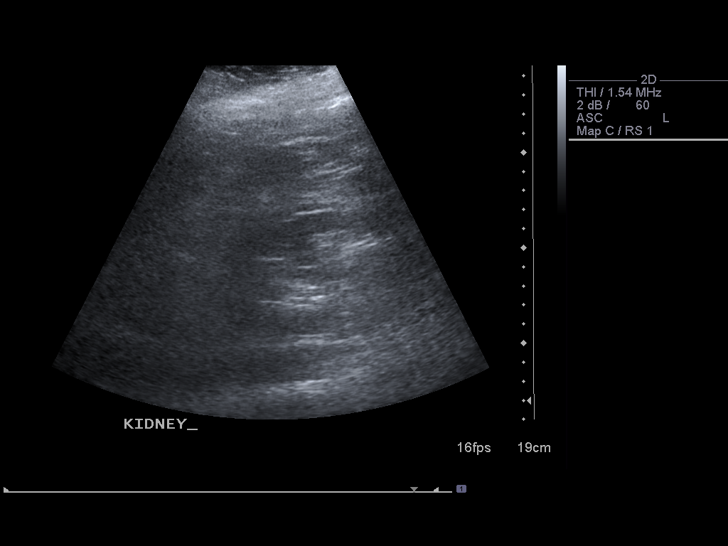
[im 44/48]
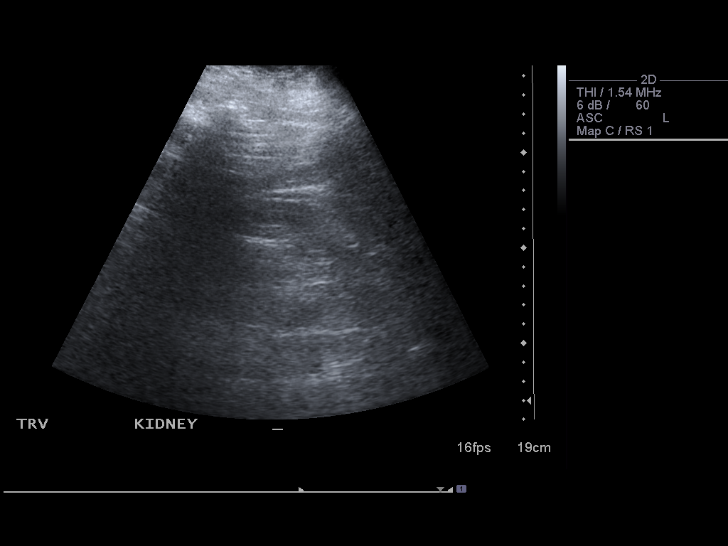
[im 48/48]
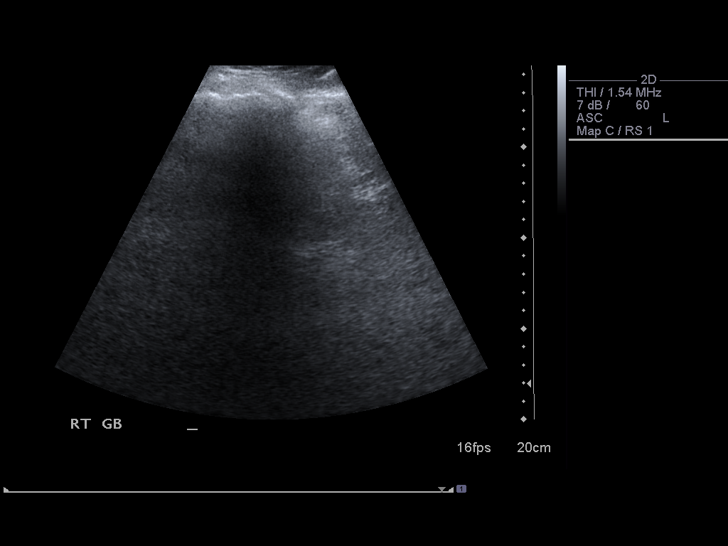

[13 of 25 positions shown; findings below may reference images not displayed]

FINDINGS: Examination was performed portably at the patient's
bedside.  Study limited by the patient's body habitus and the
inability to move the patient as there is ventilator dependent
respiratory failure.

Gallbladder:  Absent, as was noted on the prior CT.

Common bile duct:  Normal in caliber measuring approximately 5 mm
in diameter centrally.  The

Liver:  Normal size and echotexture without focal parenchymal
abnormality.  Patent portal vein with hepatopetal flow.

IVC:  Patent in its intrahepatic portion; not identified outside
the liver.

Pancreas:  Although the pancreas is difficult to visualize in its
entirety, no focal pancreatic abnormality is identified.

Spleen:  Not optimally imaged, but no gross abnormality.

Right Kidney:  Diffuse cortical thinning.  No hydronephrosis.  No
focal parenchymal abnormality.

Left Kidney:  Diffuse cortical thinning.  No hydronephrosis.  No
focal parenchymal abnormality.

Abdominal aorta:  Normal in caliber throughout its visualized
course in the abdomen.
IMPRESSION: 1.  Absent gallbladder as noted on a prior CT from 0994.  If there
has been no prior surgery, this is consistent with congenital
absence.
2.  No biliary ductal dilation.
3.  Diffuse cortical thinning involving both kidneys without focal
parenchymal abnormality.  No hydronephrosis.
4.  Technically limited study due to body habitus and ventilator
dependent respiratory failure.

## 2010-09-27 IMAGING — CR DG CHEST 1V PORT
1 series · 1 of 1 positions shown · non-contrast
Comparison: Portable exam 0797 hours compared to 7228 hours.

CLINICAL DATA: Respiratory failure, hyperkaliemia, pulmonary edema

PORTABLE CHEST - 1 VIEW

[AP]
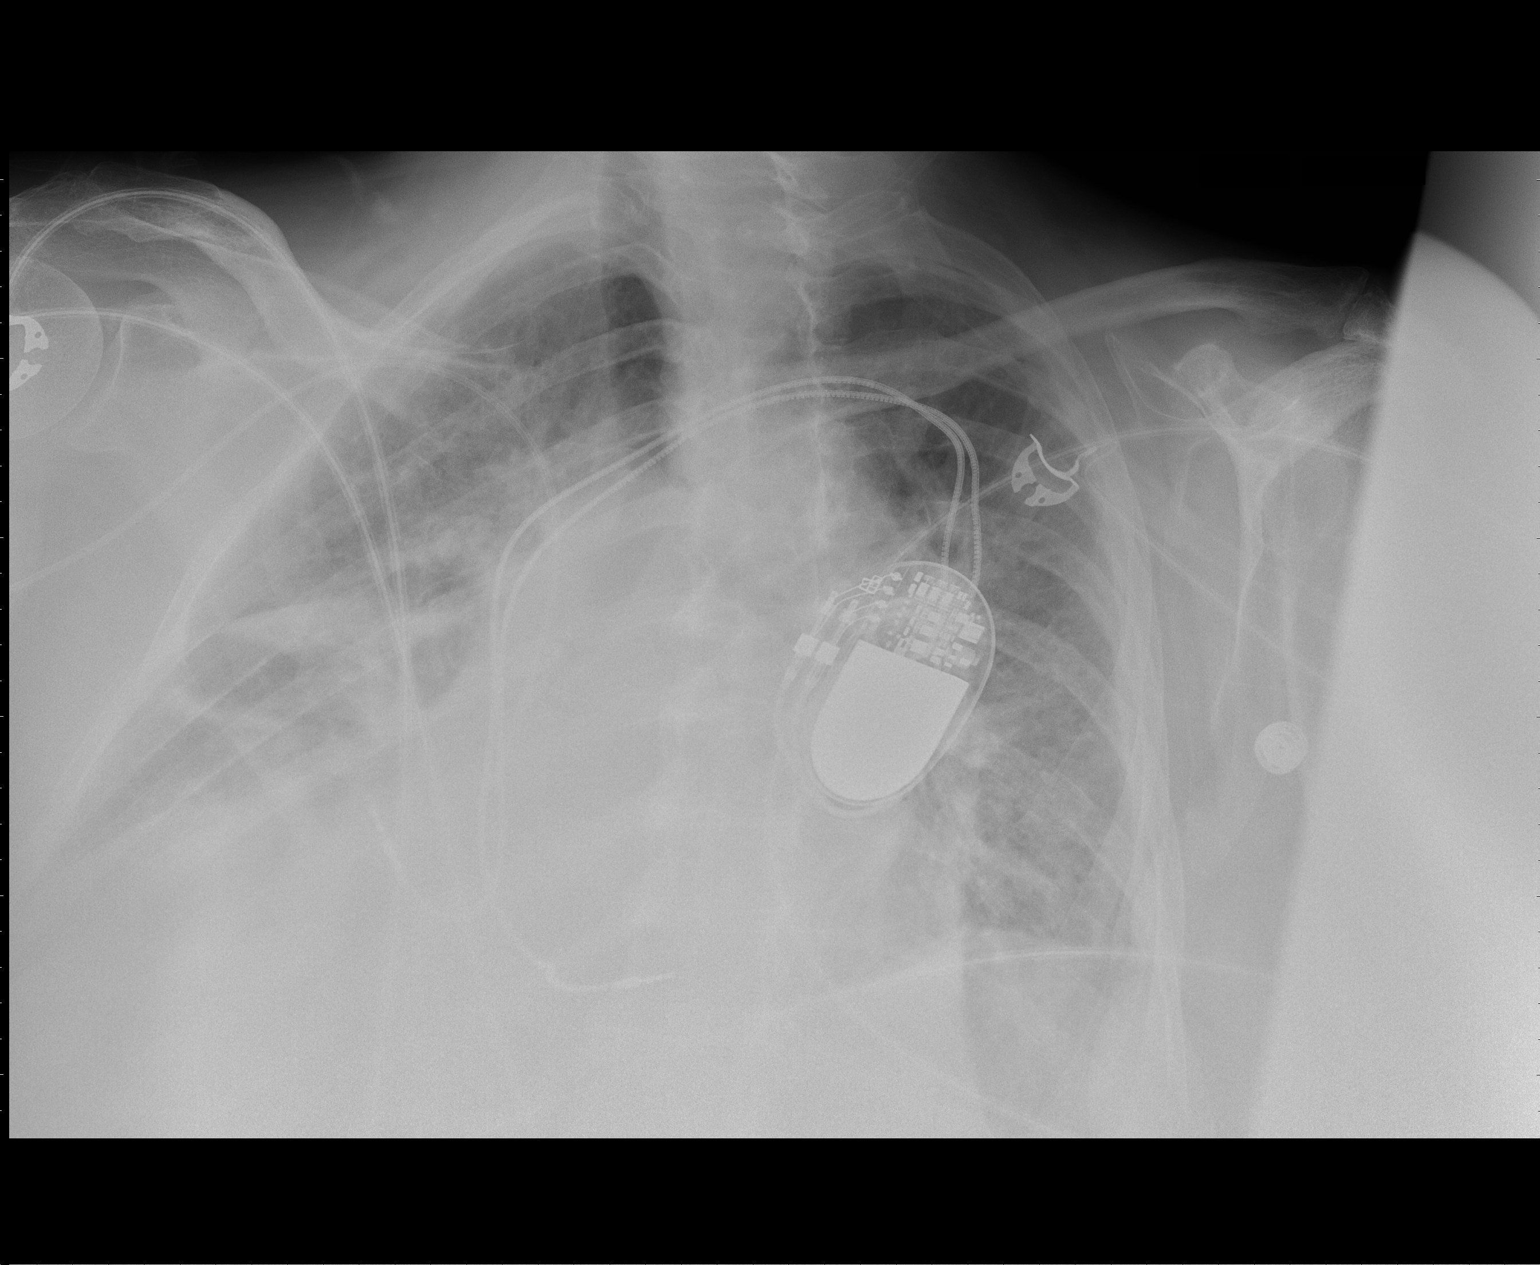

[1 of 1 positions shown; findings below may reference images not displayed]

FINDINGS: Severely rotated to the right.
Cardiac enlargement.
Pacemaker leads grossly unchanged for degree of rotation,
projecting over right atrium and right ventricle.
Right arm PICC line, tip at cavoatrial junction.
Mild cardiac enlargement with pulmonary vascular congestion.
Diffuse pulmonary infiltrates and small effusions question CHF.
Interval removal of endotracheal and nasogastric tubes.
IMPRESSION: Question CHF.

## 2010-09-29 IMAGING — CR DG CHEST 1V PORT
1 series · 1 of 1 positions shown · non-contrast
Comparison: Portable chest x-ray of 06/30/2009

CLINICAL DATA: Respiratory failure, hyperkalemia

PORTABLE CHEST - 1 VIEW

[view not recorded]
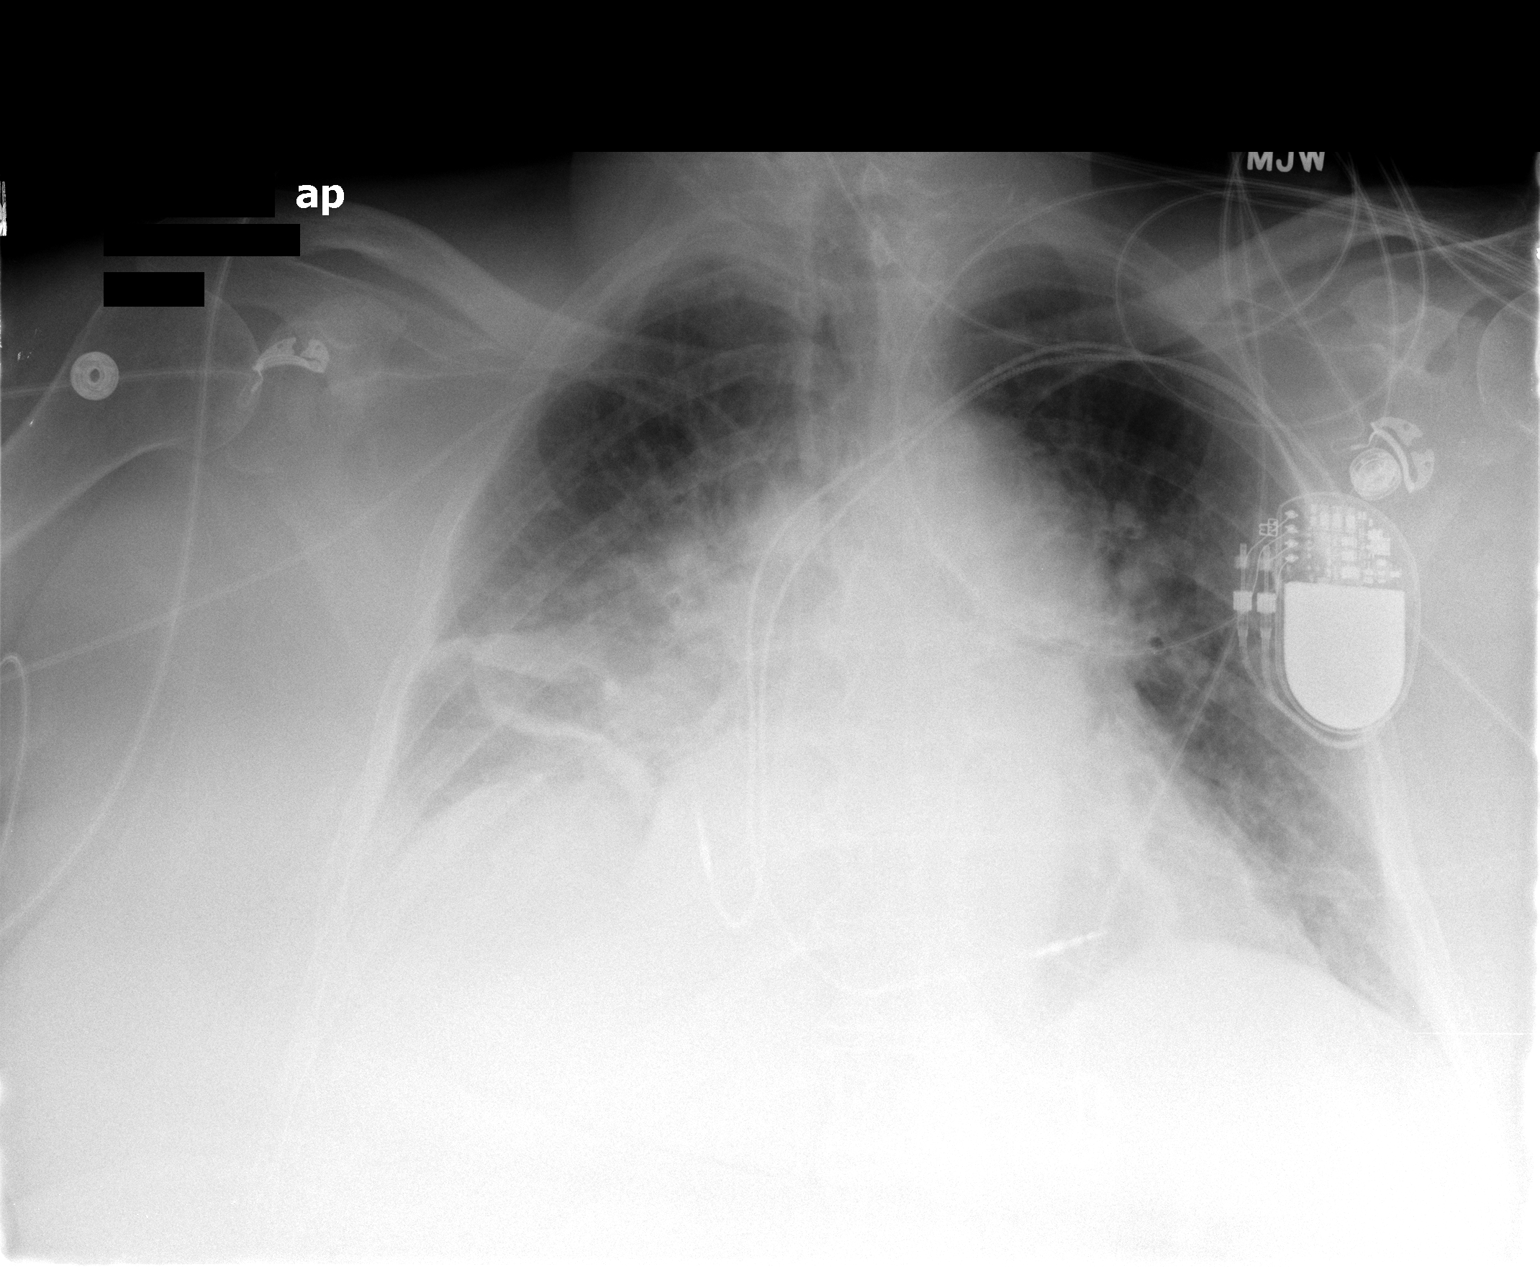

[1 of 1 positions shown; findings below may reference images not displayed]

FINDINGS: The lungs remain poorly aerated with basilar atelectasis.
There does appear to be pulmonary vascular congestion present.
Cardiomegaly is stable.  Permanent pacemaker remains.
IMPRESSION: No significant change in poor aeration with basilar atelectasis and
pulmonary vascular congestion.

## 2010-11-16 LAB — OCCULT BLOOD X 1 CARD TO LAB, STOOL: Fecal Occult Bld: NEGATIVE

## 2011-01-14 IMAGING — CR DG CHEST 1V
1 series · 1 of 1 positions shown · non-contrast
Comparison: Portable chest x-ray of 09/29/2009

CLINICAL DATA: End-stage renal disease, preop

CHEST - 1 VIEW

[view not recorded]
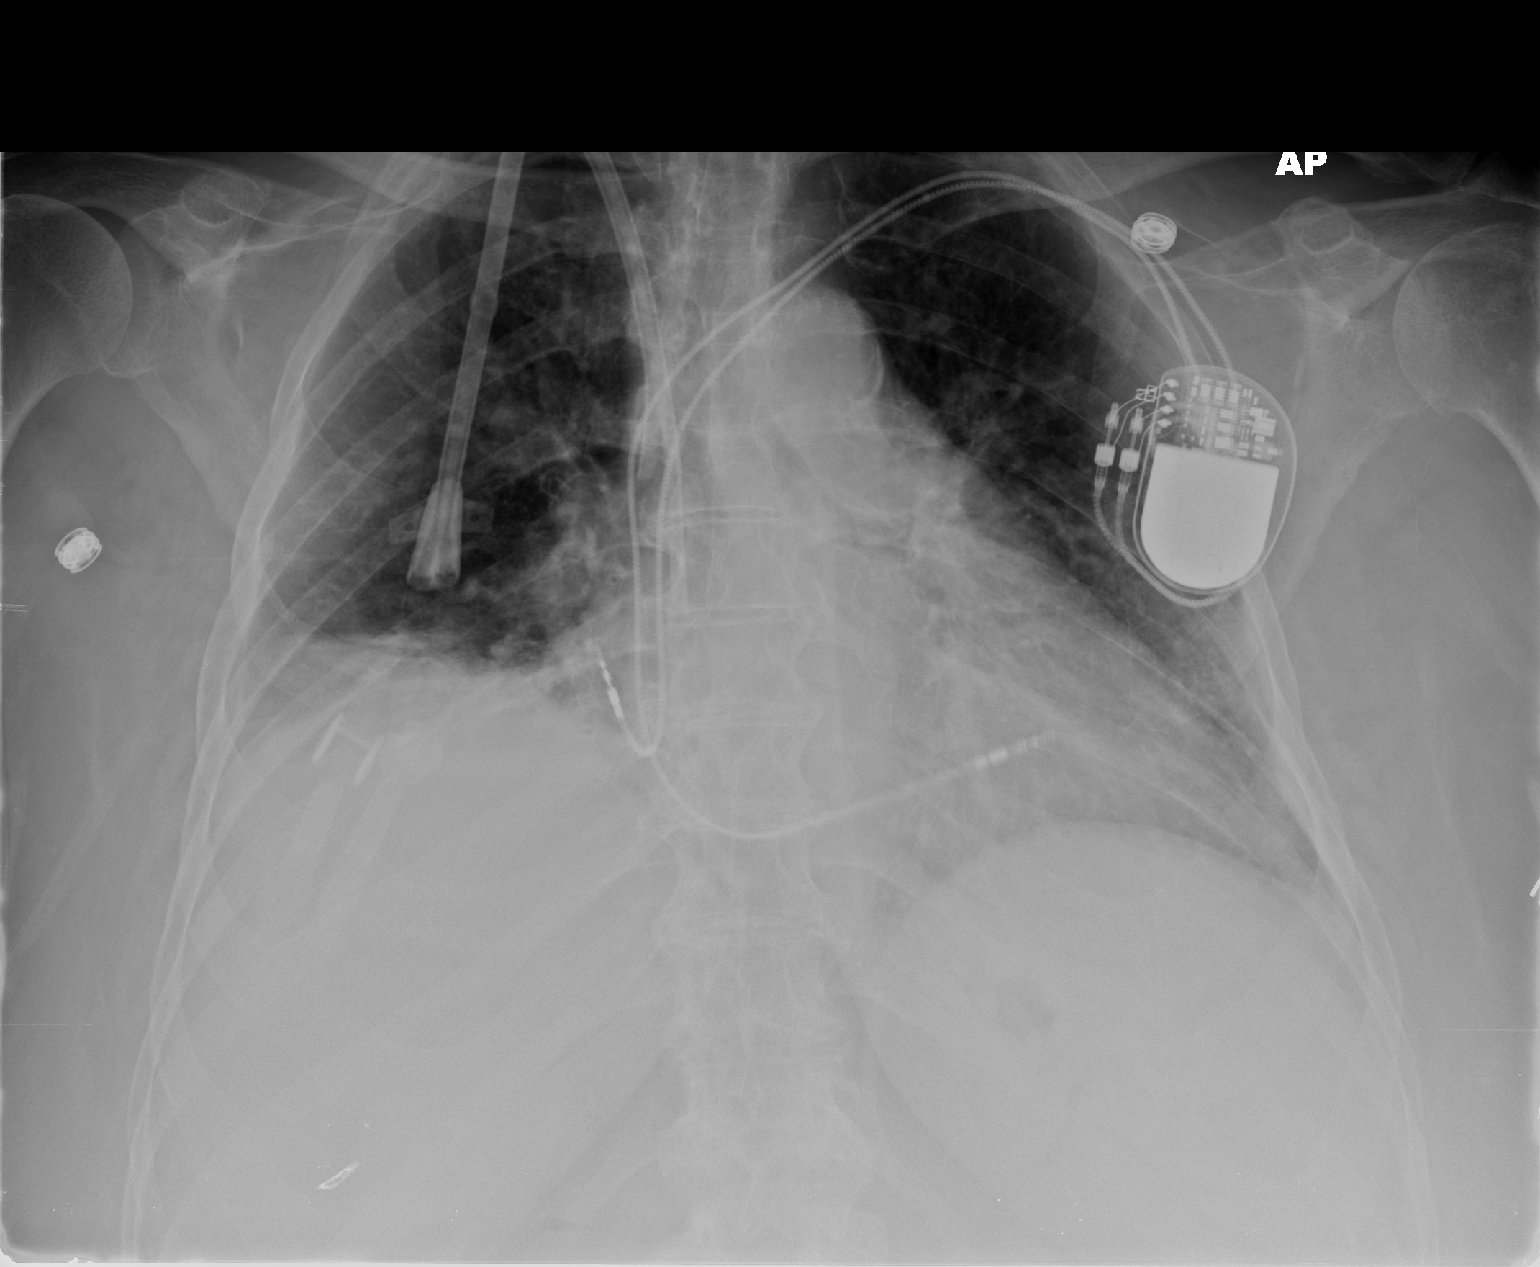

[1 of 1 positions shown; findings below may reference images not displayed]

FINDINGS: The lungs appear better aerated.  There is still
cardiomegaly present with mild pulmonary vascular congestion.  A
permanent pacemaker remains, and a right dialysis catheter remains
with the tips in the SVC/RA junction.  No bony abnormality is seen.
IMPRESSION: No significant change in cardiomegaly and pulmonary vascular
congestion.  Slightly improved aeration.
